# Patient Record
Sex: Male | Born: 1970 | Race: Black or African American | Hispanic: No | Marital: Married | Smoking: Current every day smoker
Health system: Southern US, Community
[De-identification: ages and names within clinical notes are randomized; demographics above are authoritative.]

## PROBLEM LIST (undated history)

## (undated) DIAGNOSIS — F419 Anxiety disorder, unspecified: Secondary | ICD-10-CM

## (undated) DIAGNOSIS — F329 Major depressive disorder, single episode, unspecified: Secondary | ICD-10-CM

## (undated) DIAGNOSIS — I10 Essential (primary) hypertension: Secondary | ICD-10-CM

## (undated) DIAGNOSIS — F32A Depression, unspecified: Secondary | ICD-10-CM

## (undated) DIAGNOSIS — J189 Pneumonia, unspecified organism: Secondary | ICD-10-CM

## (undated) DIAGNOSIS — E871 Hypo-osmolality and hyponatremia: Secondary | ICD-10-CM

## (undated) DIAGNOSIS — E7800 Pure hypercholesterolemia, unspecified: Secondary | ICD-10-CM

## (undated) DIAGNOSIS — D649 Anemia, unspecified: Secondary | ICD-10-CM

## (undated) DIAGNOSIS — F1010 Alcohol abuse, uncomplicated: Secondary | ICD-10-CM

## (undated) HISTORY — PX: ABSCESS DRAINAGE: SHX1119

## (undated) HISTORY — PX: BACK SURGERY: SHX140

---

## 2016-07-20 ENCOUNTER — Encounter (HOSPITAL_COMMUNITY): Payer: Self-pay

## 2016-07-20 ENCOUNTER — Emergency Department (HOSPITAL_COMMUNITY): Payer: Self-pay

## 2016-07-20 ENCOUNTER — Emergency Department (HOSPITAL_COMMUNITY)
Admission: EM | Admit: 2016-07-20 | Discharge: 2016-07-21 | Disposition: A | Discharge status: Home / Self Care | Payer: Self-pay | Attending: Emergency Medicine | Admitting: Emergency Medicine

## 2016-07-20 DIAGNOSIS — F10239 Alcohol dependence with withdrawal, unspecified: Secondary | ICD-10-CM | POA: Insufficient documentation

## 2016-07-20 DIAGNOSIS — F17290 Nicotine dependence, other tobacco product, uncomplicated: Secondary | ICD-10-CM | POA: Insufficient documentation

## 2016-07-20 DIAGNOSIS — I10 Essential (primary) hypertension: Secondary | ICD-10-CM | POA: Insufficient documentation

## 2016-07-20 DIAGNOSIS — Z79899 Other long term (current) drug therapy: Secondary | ICD-10-CM | POA: Insufficient documentation

## 2016-07-20 DIAGNOSIS — F10230 Alcohol dependence with withdrawal, uncomplicated: Secondary | ICD-10-CM

## 2016-07-20 DIAGNOSIS — F10930 Alcohol use, unspecified with withdrawal, uncomplicated: Secondary | ICD-10-CM

## 2016-07-20 DIAGNOSIS — R748 Abnormal levels of other serum enzymes: Secondary | ICD-10-CM | POA: Insufficient documentation

## 2016-07-20 DIAGNOSIS — E871 Hypo-osmolality and hyponatremia: Secondary | ICD-10-CM | POA: Insufficient documentation

## 2016-07-20 HISTORY — DX: Essential (primary) hypertension: I10

## 2016-07-20 HISTORY — DX: Depression, unspecified: F32.A

## 2016-07-20 HISTORY — DX: Anxiety disorder, unspecified: F41.9

## 2016-07-20 HISTORY — DX: Hypo-osmolality and hyponatremia: E87.1

## 2016-07-20 HISTORY — DX: Major depressive disorder, single episode, unspecified: F32.9

## 2016-07-20 HISTORY — DX: Alcohol abuse, uncomplicated: F10.10

## 2016-07-20 LAB — COMPREHENSIVE METABOLIC PANEL
ALBUMIN: 4.4 g/dL (ref 3.5–5.0)
ALK PHOS: 79 U/L (ref 38–126)
ALT: 29 U/L (ref 17–63)
AST: 45 U/L — AB (ref 15–41)
Anion gap: 8 (ref 5–15)
BILIRUBIN TOTAL: 0.3 mg/dL (ref 0.3–1.2)
BUN: 20 mg/dL (ref 6–20)
CALCIUM: 9.8 mg/dL (ref 8.9–10.3)
CO2: 25 mmol/L (ref 22–32)
Chloride: 101 mmol/L (ref 101–111)
Creatinine, Ser: 1.03 mg/dL (ref 0.61–1.24)
GFR calc Af Amer: >60 mL/min (ref >60)
GFR calc non Af Amer: >60 mL/min (ref >60)
GLUCOSE: 112 mg/dL — AB (ref 65–99)
Potassium: 4.2 mmol/L (ref 3.5–5.1)
Sodium: 134 mmol/L — ABNORMAL LOW (ref 135–145)
TOTAL PROTEIN: 8.7 g/dL — AB (ref 6.5–8.1)

## 2016-07-20 LAB — CBC
HCT: 22 % — ABNORMAL LOW (ref 39.0–52.0)
Hemoglobin: 7.5 g/dL — ABNORMAL LOW (ref 13.0–17.0)
MCH: 32.3 pg (ref 26.0–34.0)
MCHC: 34.1 g/dL (ref 30.0–36.0)
MCV: 94.8 fL (ref 78.0–100.0)
Platelets: 165 10*3/uL (ref 150–400)
RBC: 2.32 MIL/uL — ABNORMAL LOW (ref 4.22–5.81)
RDW: 12.9 % (ref 11.5–15.5)
WBC: 7.6 10*3/uL (ref 4.0–10.5)

## 2016-07-20 LAB — RAPID STREP SCREEN (MED CTR MEBANE ONLY): Streptococcus, Group A Screen (Direct): NEGATIVE

## 2016-07-20 LAB — LIPASE, BLOOD: Lipase: 1267 U/L — ABNORMAL HIGH (ref 11–51)

## 2016-07-20 LAB — POC OCCULT BLOOD, ED: FECAL OCCULT BLD: NEGATIVE

## 2016-07-20 MED ORDER — SODIUM CHLORIDE 0.9 % IV BOLUS (SEPSIS)
1000.0000 mL | Freq: Once | INTRAVENOUS | Status: AC
  Administered 2016-07-20: 1000 mL via INTRAVENOUS

## 2016-07-20 MED ORDER — ACETAMINOPHEN 325 MG PO TABS
650.0000 mg | ORAL_TABLET | Freq: Once | ORAL | Status: AC | PRN
  Administered 2016-07-20: 650 mg via ORAL
  Filled 2016-07-20: qty 2

## 2016-07-20 NOTE — ED Triage Notes (Signed)
EMS reports per facility his sodium was low and was not comfortable with him being there over the weekend. Patient complained to EMS of a sore throat and cough had tried OTC medications and cough drops without relief. Per EMS patient is under going detox from librium. Per EMS patient does have tremors but this is not new for him.

## 2016-07-20 NOTE — ED Provider Notes (Signed)
[facility]-EMERGENCY DEPT Provider Note   CSN: [id number] Arrival date & time: 07/20/16  1839     History   Chief Complaint Chief Complaint  Patient presents with  . Abnormal Lab  . Sore Throat    X1 MONTH    HPI Jaime Hodges is a 45 y.o. male.  The history is provided by the patient.  Abnormal Lab  Sore Throat  Pertinent negatives include no chest pain and no shortness of breath.  Patient was reportedly sent in from [facility] for low sodium. States he was told it was 129. States she was told to come in because with the weekend coming she didn't know if she would be able to get labs back. Patient had been feeling bad over the last month. Has had sore throat. Has had a lot of difficulty eating. States he had some throwing up. He is on Librium for detox off of alcohol. Does drink about 4-6 beers a day. He is tremulous but states this is much improved. His abdominal pain is improved and states she can eat much more now than he could a couple weeks ago. Has some dull abdominal pain but states it improved. No lightheadedness or dizziness. States he had diarrhea up until recently. Denies black stools. States he had been losing weight also.  Past Medical History:  Diagnosis Date  . Alcohol abuse   . Anxiety   . Depression   . Hypertension   . Low sodium levels     There are no active problems to display for this patient.   Past Surgical History:  Procedure Laterality Date  . ABSCESS DRAINAGE     NECK       Home Medications    Prior to Admission medications   Medication Sig Start Date End Date Taking? Authorizing Provider  ALPRAZolam (XANAX) 0.25 MG tablet Take 0.25 mg by mouth 2 (two) times daily. 04/26/16  Yes Historical Provider, MD  amLODipine (NORVASC) 5 MG tablet Take 1 tablet by mouth daily. 06/30/16  Yes Historical Provider, MD  atorvastatin (LIPITOR) 40 MG tablet Take 40 mg by mouth daily. 04/27/16  Yes Historical Provider, MD  busPIRone (BUSPAR) 7.5 MG tablet  Take 7.5 mg by mouth 2 (two) times daily. 05/18/16  Yes Historical Provider, MD  escitalopram (LEXAPRO) 10 MG tablet Take 10 mg by mouth daily. 05/23/16  Yes Historical Provider, MD  meclizine (ANTIVERT) 25 MG tablet Take 1 tablet by mouth as needed for dizziness. 06/30/16  Yes Historical Provider, MD  valsartan (DIOVAN) 320 MG tablet Take 320 mg by mouth daily. 04/26/16  Yes Historical Provider, MD    Family History History reviewed. No pertinent family history.  Social History Social History  Substance Use Topics  . Smoking status: Current Every Day Smoker    Packs/day: 0.50    Types: Cigars  . Smokeless tobacco: Never Used  . Alcohol use 2.4 oz/week    4 Cans of beer per week     Comment: DAILY     Allergies   Aspirin   Review of Systems Review of Systems  Constitutional: Positive for appetite change. Negative for fever.  HENT: Positive for sore throat.   Eyes: Negative for visual disturbance.  Respiratory: Negative for shortness of breath.   Cardiovascular: Negative for chest pain.  Gastrointestinal: Positive for anal bleeding.  Genitourinary: Negative for flank pain.  Musculoskeletal: Negative for back pain.  Skin: Negative for wound.  Neurological: Positive for tremors.  Psychiatric/Behavioral: Negative for confusion.  Physical Exam Updated Vital Signs BP 136/100   Pulse 71   Temp 99.4 F (37.4 C) (Oral)   Resp 22   Ht 5\' 9"  (1.753 m)   Wt 146 lb (66.2 kg)   SpO2 100%   BMI 21.56 kg/m   Physical Exam  Constitutional: He appears well-developed.  HENT:  Head: Atraumatic.  Eyes: EOM are normal.  Neck: Neck supple.  Cardiovascular: Normal rate.   Pulmonary/Chest: Effort normal.  Abdominal: Soft. There is no tenderness.  Musculoskeletal: He exhibits no edema.  Neurological: He is alert.  Patient does have a tremor in both his hands.  Skin: There is pallor.  Psychiatric: He has a normal mood and affect.     ED Treatments / Results  Labs (all  labs ordered are listed, but only abnormal results are displayed) Labs Reviewed  LIPASE, BLOOD - Abnormal; Notable for the following:       Result Value   Lipase 1,267 (*)    All other components within normal limits  COMPREHENSIVE METABOLIC PANEL - Abnormal; Notable for the following:    Sodium 134 (*)    Glucose, Bld 112 (*)    Total Protein 8.7 (*)    AST 45 (*)    All other components within normal limits  CBC - Abnormal; Notable for the following:    RBC 2.32 (*)    Hemoglobin 7.5 (*)    HCT 22.0 (*)    All other components within normal limits  RAPID STREP SCREEN (NOT AT [facility])  CULTURE, GROUP A STREP ([facility])  URINALYSIS, ROUTINE W REFLEX MICROSCOPIC (NOT AT [facility])  POC OCCULT BLOOD, ED    EKG  EKG Interpretation None       Radiology Dg Chest 2 View  Result Date: 07/20/2016 CLINICAL DATA:  45 year old male with cough EXAM: CHEST  2 VIEW COMPARISON:  None. FINDINGS: The heart size and mediastinal contours are within normal limits. Both lungs are clear. The visualized skeletal structures are unremarkable. IMPRESSION: No active cardiopulmonary disease. Electronically Signed   By: Elgie Collard M.D.   On: 07/20/2016 21:19    Procedures Procedures (including critical care time)  Medications Ordered in ED Medications  acetaminophen (TYLENOL) tablet 650 mg (650 mg Oral Given 07/20/16 2009)  sodium chloride 0.9 % bolus 1,000 mL (1,000 mLs Intravenous New Bag/Given 07/20/16 2159)     Initial Impression / Assessment and Plan / ED Course  I have reviewed the triage vital signs and the nursing notes.  Pertinent labs & imaging results that were available during my care of the patient were reviewed by me and considered in my medical decision making (see chart for details).  Clinical Course    Patient presents with hyponatremia. Reportedly had sodium of 129. He is at [facility] for his alcohol abuse. He's been clean for about 3 days. States he is feeling much  better. Also had a sore throat. He is reportedly on Librium. He said his tremors improved. States his appetite is improved a lot. No real abdominal tenderness. His lipase is elevated but think this is likely decreasing. He has however had weight loss and will need to have this followed. Sodium is improved OB discharge home. Sodium is now 134.  Final Clinical Impressions(s) / ED Diagnoses   Final diagnoses:  Hyponatremia  Alcohol withdrawal syndrome without complication (HCC)  Elevated lipase    New Prescriptions New Prescriptions   No medications on file     Benjiman Core, MD 07/20/16 2347

## 2016-07-20 NOTE — Discharge Instructions (Signed)
Your sodium is improved to 134. You have been given 1 L of IV normal saline also. Continue your improved oral intake. Lipase is elevated but since your abdominal pain has improved I think this is likely decreasing. Return for increasing pain or fever cannot tolerate orals. Follow-up for further monitoring of the lipase as an outpatient.

## 2016-07-23 LAB — CULTURE, GROUP A STREP (THRC)

## 2016-07-24 ENCOUNTER — Telehealth (HOSPITAL_BASED_OUTPATIENT_CLINIC_OR_DEPARTMENT_OTHER): Payer: Self-pay | Admitting: Emergency Medicine

## 2016-07-24 NOTE — Progress Notes (Signed)
ED Antimicrobial Stewardship Positive Culture Follow Up   Jaime Hodges is an 45 y.o. male who [facility] with a chief complaint of  Chief Complaint  Patient presents with  . Abnormal Lab  . Sore Throat    X1 MONTH    Recent Results (from the past 720 hour(s))  Rapid strep screen     Status: None   Collection Time: 07/20/16  8:05 PM  Result Value Ref Range Status   Streptococcus, Group A Screen (Direct) NEGATIVE NEGATIVE Final    Comment: (NOTE) A Rapid Antigen test may result negative if the antigen level in the sample is below the detection level of this test. The FDA has not cleared this test as a stand-alone test therefore the rapid antigen negative result has reflexed to a Group A Strep culture.   Culture, group A strep     Status: None   Collection Time: 07/20/16  8:05 PM  Result Value Ref Range Status   Specimen Description THROAT  Final   Special Requests NONE Reflexed from [id number]  Final   Culture MODERATE GROUP A STREP (S.PYOGENES) ISOLATED  Final   Report Status 07/23/2016 FINAL  Final    [x]  Patient discharged originally without antimicrobial agent and treatment is now indicated  New antibiotic prescription: amoxicillin 500 mg PO BID for 10 days  ED Provider: Allen DerryMercedes Camprubi-Soms, PA-C  Mackie Paienee Ackley, PharmD PGY1 Pharmacy Resident Pager: [phone] 07/24/2016 9:01 AM

## 2016-07-24 NOTE — Telephone Encounter (Signed)
Post ED Visit - Positive Culture Follow-up: Successful Patient Follow-Up  Culture assessed and recommendations reviewed by: []  Enzo BiNathan Batchelder, Pharm.D. []  Celedonio MiyamotoJeremy Frens, Pharm.D., BCPS []  Garvin FilaMike Maccia, Pharm.D. []  Georgina PillionElizabeth Martin, Pharm.D., BCPS []  SouthworthMinh Pham, [address] BoulevardPharm.D., BCPS, AAHIVP []  Estella HuskMichelle Turner, Pharm.D., BCPS, AAHIVP []  Tennis Mustassie Stewart, Pharm.D. []  Sherle Poeob Vincent, [place]arm[place]D. Mackie Paienee Ackley PharmD  Positive strep culture  [x]  Patient discharged without antimicrobial prescription and treatment is now indicated []  Organism is resistant to prescribed ED discharge antimicrobial []  Patient with positive blood cultures  Changes discussed with ED provider: Allen DerryMercedes Camprubi-Soms [facility] New antibiotic prescription Amoxicillin 500mg  po bid x 10 days  Attempting to reach patient   Berle MullMiller, Zaylee Cornia 07/24/2016, 1:01 PM

## 2016-07-25 ENCOUNTER — Inpatient Hospital Stay (HOSPITAL_COMMUNITY): Payer: BLUE CROSS/BLUE SHIELD

## 2016-07-25 ENCOUNTER — Encounter (HOSPITAL_COMMUNITY): Payer: Self-pay | Admitting: Emergency Medicine

## 2016-07-25 ENCOUNTER — Observation Stay (HOSPITAL_COMMUNITY)
Admission: EM | Admit: 2016-07-25 | Discharge: 2016-07-28 | Payer: BLUE CROSS/BLUE SHIELD | Attending: Internal Medicine | Admitting: Internal Medicine

## 2016-07-25 ENCOUNTER — Emergency Department (HOSPITAL_COMMUNITY): Payer: BLUE CROSS/BLUE SHIELD

## 2016-07-25 DIAGNOSIS — R042 Hemoptysis: Secondary | ICD-10-CM | POA: Diagnosis not present

## 2016-07-25 DIAGNOSIS — F329 Major depressive disorder, single episode, unspecified: Secondary | ICD-10-CM | POA: Insufficient documentation

## 2016-07-25 DIAGNOSIS — Z8249 Family history of ischemic heart disease and other diseases of the circulatory system: Secondary | ICD-10-CM | POA: Insufficient documentation

## 2016-07-25 DIAGNOSIS — K859 Acute pancreatitis without necrosis or infection, unspecified: Secondary | ICD-10-CM | POA: Diagnosis not present

## 2016-07-25 DIAGNOSIS — Z836 Family history of other diseases of the respiratory system: Secondary | ICD-10-CM

## 2016-07-25 DIAGNOSIS — R918 Other nonspecific abnormal finding of lung field: Secondary | ICD-10-CM | POA: Insufficient documentation

## 2016-07-25 DIAGNOSIS — R634 Abnormal weight loss: Secondary | ICD-10-CM | POA: Insufficient documentation

## 2016-07-25 DIAGNOSIS — E7800 Pure hypercholesterolemia, unspecified: Secondary | ICD-10-CM | POA: Insufficient documentation

## 2016-07-25 DIAGNOSIS — Z7289 Other problems related to lifestyle: Secondary | ICD-10-CM

## 2016-07-25 DIAGNOSIS — F17210 Nicotine dependence, cigarettes, uncomplicated: Secondary | ICD-10-CM | POA: Insufficient documentation

## 2016-07-25 DIAGNOSIS — F1010 Alcohol abuse, uncomplicated: Secondary | ICD-10-CM | POA: Diagnosis not present

## 2016-07-25 DIAGNOSIS — F419 Anxiety disorder, unspecified: Secondary | ICD-10-CM | POA: Diagnosis not present

## 2016-07-25 DIAGNOSIS — R21 Rash and other nonspecific skin eruption: Secondary | ICD-10-CM | POA: Insufficient documentation

## 2016-07-25 DIAGNOSIS — D509 Iron deficiency anemia, unspecified: Secondary | ICD-10-CM | POA: Insufficient documentation

## 2016-07-25 DIAGNOSIS — Z833 Family history of diabetes mellitus: Secondary | ICD-10-CM | POA: Insufficient documentation

## 2016-07-25 DIAGNOSIS — R1013 Epigastric pain: Secondary | ICD-10-CM | POA: Diagnosis not present

## 2016-07-25 DIAGNOSIS — Z8349 Family history of other endocrine, nutritional and metabolic diseases: Secondary | ICD-10-CM

## 2016-07-25 DIAGNOSIS — J020 Streptococcal pharyngitis: Secondary | ICD-10-CM | POA: Diagnosis not present

## 2016-07-25 DIAGNOSIS — F17290 Nicotine dependence, other tobacco product, uncomplicated: Secondary | ICD-10-CM

## 2016-07-25 DIAGNOSIS — K0510 Chronic gingivitis, plaque induced: Secondary | ICD-10-CM | POA: Diagnosis not present

## 2016-07-25 DIAGNOSIS — Z23 Encounter for immunization: Secondary | ICD-10-CM | POA: Diagnosis not present

## 2016-07-25 DIAGNOSIS — K068 Other specified disorders of gingiva and edentulous alveolar ridge: Secondary | ICD-10-CM | POA: Diagnosis not present

## 2016-07-25 DIAGNOSIS — Z886 Allergy status to analgesic agent status: Secondary | ICD-10-CM | POA: Diagnosis not present

## 2016-07-25 DIAGNOSIS — Z79899 Other long term (current) drug therapy: Secondary | ICD-10-CM

## 2016-07-25 DIAGNOSIS — I10 Essential (primary) hypertension: Secondary | ICD-10-CM | POA: Insufficient documentation

## 2016-07-25 DIAGNOSIS — Z789 Other specified health status: Secondary | ICD-10-CM

## 2016-07-25 DIAGNOSIS — R630 Anorexia: Secondary | ICD-10-CM | POA: Insufficient documentation

## 2016-07-25 DIAGNOSIS — D649 Anemia, unspecified: Secondary | ICD-10-CM | POA: Diagnosis present

## 2016-07-25 DIAGNOSIS — F411 Generalized anxiety disorder: Secondary | ICD-10-CM

## 2016-07-25 DIAGNOSIS — R1310 Dysphagia, unspecified: Secondary | ICD-10-CM

## 2016-07-25 DIAGNOSIS — K852 Alcohol induced acute pancreatitis without necrosis or infection: Secondary | ICD-10-CM

## 2016-07-25 HISTORY — DX: Pure hypercholesterolemia, unspecified: E78.00

## 2016-07-25 HISTORY — DX: Pneumonia, unspecified organism: J18.9

## 2016-07-25 HISTORY — DX: Anemia, unspecified: D64.9

## 2016-07-25 LAB — URINALYSIS, ROUTINE W REFLEX MICROSCOPIC
BILIRUBIN URINE: NEGATIVE
Glucose, UA: NEGATIVE mg/dL
Hgb urine dipstick: NEGATIVE
KETONES UR: NEGATIVE mg/dL
LEUKOCYTES UA: NEGATIVE
NITRITE: NEGATIVE
PH: 7 (ref 5.0–8.0)
PROTEIN: NEGATIVE mg/dL
Specific Gravity, Urine: 1.016 (ref 1.005–1.030)

## 2016-07-25 LAB — BASIC METABOLIC PANEL
ANION GAP: 3 — AB (ref 5–15)
BUN: 11 mg/dL (ref 6–20)
CO2: 29 mmol/L (ref 22–32)
Calcium: 9.8 mg/dL (ref 8.9–10.3)
Chloride: 108 mmol/L (ref 101–111)
Creatinine, Ser: 0.77 mg/dL (ref 0.61–1.24)
GFR calc Af Amer: >60 mL/min (ref >60)
GLUCOSE: 130 mg/dL — AB (ref 65–99)
POTASSIUM: 4 mmol/L (ref 3.5–5.1)
Sodium: 140 mmol/L (ref 135–145)

## 2016-07-25 LAB — FOLATE: FOLATE: 19.2 ng/mL (ref >5.9)

## 2016-07-25 LAB — IRON AND TIBC
Iron: 39 ug/dL — ABNORMAL LOW (ref 45–182)
SATURATION RATIOS: 13 % — AB (ref 17.9–39.5)
TIBC: 293 ug/dL (ref 250–450)
UIBC: 254 ug/dL

## 2016-07-25 LAB — CBC
HEMATOCRIT: 21.6 % — AB (ref 39.0–52.0)
Hemoglobin: 7 g/dL — ABNORMAL LOW (ref 13.0–17.0)
MCH: 32 pg (ref 26.0–34.0)
MCHC: 32.4 g/dL (ref 30.0–36.0)
MCV: 98.6 fL (ref 78.0–100.0)
PLATELETS: 171 10*3/uL (ref 150–400)
RBC: 2.19 MIL/uL — AB (ref 4.22–5.81)
RDW: 14.1 % (ref 11.5–15.5)
WBC: 5.7 10*3/uL (ref 4.0–10.5)

## 2016-07-25 LAB — RETICULOCYTES
RBC.: 2.15 MIL/uL — AB (ref 4.22–5.81)
RETIC COUNT ABSOLUTE: 64.5 10*3/uL (ref 19.0–186.0)
RETIC CT PCT: 3 % (ref 0.4–3.1)

## 2016-07-25 LAB — FERRITIN: Ferritin: 335 ng/mL (ref 24–336)

## 2016-07-25 LAB — LACTATE DEHYDROGENASE: LDH: 132 U/L (ref 98–192)

## 2016-07-25 LAB — POC OCCULT BLOOD, ED: Fecal Occult Bld: NEGATIVE

## 2016-07-25 LAB — MAGNESIUM: MAGNESIUM: 1.5 mg/dL — AB (ref 1.7–2.4)

## 2016-07-25 LAB — PROTIME-INR
INR: 1.07
Prothrombin Time: 13.9 seconds (ref 11.4–15.2)

## 2016-07-25 LAB — VITAMIN B12: VITAMIN B 12: 483 pg/mL (ref 180–914)

## 2016-07-25 LAB — LIPASE, BLOOD: LIPASE: 375 U/L — AB (ref 11–51)

## 2016-07-25 MED ORDER — ADULT MULTIVITAMIN W/MINERALS CH
1.0000 | ORAL_TABLET | Freq: Every day | ORAL | Status: DC
  Administered 2016-07-25 – 2016-07-28 (×4): 1 via ORAL
  Filled 2016-07-25 (×4): qty 1

## 2016-07-25 MED ORDER — AMOXICILLIN 500 MG PO CAPS
500.0000 mg | ORAL_CAPSULE | Freq: Two times a day (BID) | ORAL | Status: DC
  Administered 2016-07-25 – 2016-07-28 (×6): 500 mg via ORAL
  Filled 2016-07-25 (×8): qty 1

## 2016-07-25 MED ORDER — BOOST / RESOURCE BREEZE PO LIQD
1.0000 | Freq: Two times a day (BID) | ORAL | Status: DC
  Administered 2016-07-26 – 2016-07-28 (×5): 1 via ORAL

## 2016-07-25 MED ORDER — ESCITALOPRAM OXALATE 10 MG PO TABS
10.0000 mg | ORAL_TABLET | Freq: Every day | ORAL | Status: DC
  Administered 2016-07-25 – 2016-07-28 (×4): 10 mg via ORAL
  Filled 2016-07-25 (×4): qty 1

## 2016-07-25 MED ORDER — FERUMOXYTOL INJECTION 510 MG/17 ML
510.0000 mg | Freq: Once | INTRAVENOUS | Status: AC
  Administered 2016-07-25: 510 mg via INTRAVENOUS
  Filled 2016-07-25: qty 17

## 2016-07-25 MED ORDER — PANTOPRAZOLE SODIUM 40 MG PO TBEC
40.0000 mg | DELAYED_RELEASE_TABLET | Freq: Two times a day (BID) | ORAL | Status: DC
  Administered 2016-07-25 – 2016-07-27 (×4): 40 mg via ORAL
  Filled 2016-07-25 (×5): qty 1

## 2016-07-25 MED ORDER — VITAMIN B-1 100 MG PO TABS
100.0000 mg | ORAL_TABLET | Freq: Every day | ORAL | Status: DC
  Administered 2016-07-25 – 2016-07-28 (×4): 100 mg via ORAL
  Filled 2016-07-25 (×4): qty 1

## 2016-07-25 MED ORDER — FAMOTIDINE IN NACL 20-0.9 MG/50ML-% IV SOLN
20.0000 mg | Freq: Two times a day (BID) | INTRAVENOUS | Status: DC
  Administered 2016-07-25: 20 mg via INTRAVENOUS
  Filled 2016-07-25 (×2): qty 50

## 2016-07-25 MED ORDER — SODIUM CHLORIDE 0.9 % IV BOLUS (SEPSIS)
500.0000 mL | Freq: Once | INTRAVENOUS | Status: AC
  Administered 2016-07-25: 500 mL via INTRAVENOUS

## 2016-07-25 MED ORDER — BUSPIRONE HCL 15 MG PO TABS
7.5000 mg | ORAL_TABLET | Freq: Two times a day (BID) | ORAL | Status: DC
  Administered 2016-07-25 – 2016-07-26 (×2): 7.5 mg via ORAL
  Filled 2016-07-25 (×2): qty 1

## 2016-07-25 MED ORDER — FOLIC ACID 1 MG PO TABS
1.0000 mg | ORAL_TABLET | Freq: Every day | ORAL | Status: DC
  Administered 2016-07-25 – 2016-07-28 (×4): 1 mg via ORAL
  Filled 2016-07-25 (×4): qty 1

## 2016-07-25 MED ORDER — INFLUENZA VAC SPLIT QUAD 0.5 ML IM SUSY
0.5000 mL | PREFILLED_SYRINGE | INTRAMUSCULAR | Status: AC
  Administered 2016-07-26: 0.5 mL via INTRAMUSCULAR
  Filled 2016-07-25: qty 0.5

## 2016-07-25 MED ORDER — PNEUMOCOCCAL VAC POLYVALENT 25 MCG/0.5ML IJ INJ
0.5000 mL | INJECTION | INTRAMUSCULAR | Status: AC
  Administered 2016-07-26: 0.5 mL via INTRAMUSCULAR
  Filled 2016-07-25: qty 0.5

## 2016-07-25 NOTE — H&P (Signed)
Date: 07/25/2016               Patient Name:  Jaime Hodges MRN: [id number]  DOB: 07-03-1971 Age / Sex: 45 y.o., male   PCP: No primary care provider on file.         Medical Service: Internal Medicine Teaching Service         Attending Physician: Dr. Inez Catalina, MD    First Contact: Dr. Reymundo Poll Pager: [phone]  Second Contact: Dr. Gwynn Burly Pager: [phone]       After Hours (After 5p/  First Contact Pager: [phone]  weekends / holidays): Second Contact Pager: [phone]   Chief Complaint: Sore Throat  History of Present Illness: Patient is a 45 yo M with a pmhx significant for HTN and alcohol use disorder who presents with sore throat for 3 months. Patient is currently undergoing outpatient alcohol detox at [facility] and has been sober for 7 days. He was sent to the ER on the 19th of this month for low sodium and his rapid strep screen was negative at that time; however the cultures later grew group A strep. Per chart review, it appears the patient was unable to be reached by telephone and he has not received any treatment. He also endorses a productive cough with hemoptysis on multiple occasions. He reports an episode of emesis a few days ago that was red in color, but he had eaten ravioli that evening as well. Patient says he experienced significant weight loss while he was drinking due to poor po intake. He reports losing about 100 lbs in the past 1.5 years. Since starting rehab, he has started to gain weight back and his BM are starting to normalize. When he first started to eat again, he reports multiple episodes of loose stool a day (5-6x). Over the past few days his stools have begun to harden and become less frequent, now 2-3 x a day. He denies any blood in his stool. He denies SOB or feeling light headed or dizzy. He denies fevers but endorses chills for the past few days. He also developed a rash on the back on his next and stomach this morning.   In the ED,  vitals were stable (T 99.4, BP 123/90, HR 96, RR 22, oxygen 100% on RA). Labs were significant for normocytic anemia with hgb 7.0. FOBT was negative. Retic count was normal at 3.0. Vit B12 and Folate were both within normal limits. Ferritin was also normal but total iron was low at 39. UA is pending. Lipase was elevated 375 but down from 1,267 5 days ago. CXR was negative for acute disease.   Meds:  No outpatient prescriptions have been marked as taking for the 07/25/16 encounter [facility] Encounter).     Allergies: Allergies as of 07/25/2016 - Review Complete 07/25/2016  Allergen Reaction Noted  . Aspirin Other (See Comments) 07/20/2016   Past Medical History:  Diagnosis Date  . Alcohol abuse   . Anxiety   . Depression   . Hypertension   . Low sodium levels     Family History: HTN, DM, HLD and tracheostomy all in his mom   Social History: Currently living at [facility] for alcohol detox. Normally lives at home with his mother. He and his wife divorced one year ago. He smokes 2-3 black and mild's a day, started smoking about 6 months ago. He denies illicit drug use.   Review of Systems: A complete ROS was negative  except as per HPI.   Physical Exam: Blood pressure 135/97, pulse 83, temperature 99.1 F (37.3 C), temperature source Oral, resp. rate 19, SpO2 100 %. Constitutional: NAD, appears comfortable, Hoarse voice HEENT: Active gingival bleeding, posterior pharynx with mucosal hemorrhage.  Neck: Supple, trachea midline.  Cardiovascular: RRR, no murmurs, rubs, or gallops.  Pulmonary/Chest: CTAB, no wheezes, rales, or rhonchi. Abdominal: Soft, non tender, non distended. +BS.  Extremities: Warm and well perfused. Distal pulses intact. No edema.  Neurological: A&Ox3, CN II - XII grossly intact.  Skin: Follicular appearing rash on posterior neck and stomach  Psychiatric: Normal mood and affect  EKG: Personally reviewed. Normal sinus rhythm. No ischemic changes.   CXR:  Personally reviewed. Normal chest xray.   Assessment & Plan by Problem:  Anemia: With active gingival bleeding on evaluation. Platelets are normal. No family history of bleeding disorders that he is aware of. Unclear etiology. Patient reports a questionable episode of hematemesis recently. With his history of chronic alcohol use, esophageal varices are a possibility. However PT and INR are normal. LFTs are largely normal but this does not rule out cirrhosis. FOBT was negative today and patient denies blood in his stool. UA without hematuria. S/p IV feraheme in ED.  -- Vit C level  -- Mag level -- RUQ ultrasound to evaluate for cirrhosis  -- LDH and haptoglobin to evaluate for hemolysis -- HIV antibodies pending  -- May consider further hemophilia work up if other labs are non contributory  -- Protonix 40 mg BID  Streptococcal Pharyngitis: Recent throat swab culture with group A strep.  -- Amoxicillin 500 mg BID   HTN: -- Hold home amlodipine 5 mg and HCTZ 12.5 mg daily  Alcohol Use Disorder: -- Continue folic acid, thiamine, and multivitamin daily  Depression: -- Continue buspar BID -- Continue lexapro daily   FEN: No fluids, replete lytes prn, regular diet VTE ppx: SCDs Code Status: FULL  Dispo: Admit patient to Observation with expected length of stay less than 2 midnights.  Signed: Reymundo Pollarolyn Lawson Mahone, MD 07/25/2016, 2:27 PM  Pager: [phone]

## 2016-07-25 NOTE — ED Triage Notes (Signed)
The patient said he has been spitting up blood so the facility called.  The [facility] for [facility] wanted [facility] seen and a full workup done.  He rated his pain 7/10.  The paitent has not taken anything for the pain.  He is allergic to Aspirin.

## 2016-07-25 NOTE — ED Provider Notes (Signed)
[facility]-EMERGENCY DEPT Provider Note   CSN: [id number] Arrival date & time: 07/25/16  1125     History   Chief Complaint Chief Complaint  Patient presents with  . Sore Throat    The patient said he has been spitting up blood so the facility called.  The MD for [facility] wanted him seen and a full workup done.  He rated his pain 7/10.  The paitent has not taken anything for the pain.  He is allergic to Aspirin.    HPI Jaime Hodges is a 45 y.o. male.  HPI  44 y.o. [facility] from [facility] presents today stating sore throat and hemoptysis.  Sore throat for three weeks, worse with swallowing, no improvement with lozenges or salt water gargles, no fb sensation.  Feels like he will choke with eating.  No fever, abdominal pain,chest pain,or dyspnea.  Cough productive of red tinged sputum began yesterday and occurred again this am.  Patient has been detox'd from alchol now for 8 days.  He continues to take his antihypertensives.    Patient with anemia noted on prior lab exam at [facility] 10/19.  Patient endorses tarry stools and loose stools for several months.  No fever or antibiotics associated. He denies prior history of gi bleeding, ulcer, or nsaids- patient allergic to aspirin.   Past Medical History:  Diagnosis Date  . Alcohol abuse   . Anxiety   . Depression   . Hypertension   . Low sodium levels     There are no active problems to display for this patient.   Past Surgical History:  Procedure Laterality Date  . ABSCESS DRAINAGE     NECK       Home Medications    Prior to Admission medications   Medication Sig Start Date End Date Taking? Authorizing Provider  ALPRAZolam (XANAX) 0.25 MG tablet Take 0.25 mg by mouth 2 (two) times daily. 04/26/16   Historical Provider, MD  amLODipine (NORVASC) 5 MG tablet Take 1 tablet by mouth daily. 06/30/16   Historical Provider, MD  atorvastatin (LIPITOR) 40 MG tablet Take 40 mg by mouth daily. 04/27/16   Historical Provider, MD    busPIRone (BUSPAR) 7.5 MG tablet Take 7.5 mg by mouth 2 (two) times daily. 05/18/16   Historical Provider, MD  escitalopram (LEXAPRO) 10 MG tablet Take 10 mg by mouth daily. 05/23/16   Historical Provider, MD  meclizine (ANTIVERT) 25 MG tablet Take 1 tablet by mouth as needed for dizziness. 06/30/16   Historical Provider, MD  valsartan (DIOVAN) 320 MG tablet Take 320 mg by mouth daily. 04/26/16   Historical Provider, MD    Family History History reviewed. No pertinent family history.  Social History Social History  Substance Use Topics  . Smoking status: Current Every Day Smoker    Packs/day: 0.50    Types: Cigars  . Smokeless tobacco: Never Used  . Alcohol use 2.4 oz/week    4 Cans of beer per week     Comment: DAILY     Allergies   Aspirin   Review of Systems Review of Systems  All other systems reviewed and are negative.    Physical Exam Updated Vital Signs BP (!) 150/104 (BP Location: Right Arm)   Pulse 108   Temp 99.1 F (37.3 C) (Oral)   Resp 16   SpO2 100%   Physical Exam  Constitutional: He is oriented to person, place, and time. He appears well-developed and well-nourished.  HENT:  Head: Normocephalic and  atraumatic.  Right Ear: External ear normal.  Left Ear: External ear normal.  Nose: Nose normal.  Mouth/Throat: Oropharynx is clear and moist.  Eyes: Conjunctivae and EOM are normal. Pupils are equal, round, and reactive to light.  Neck: Normal range of motion. Neck supple.  Cardiovascular: Normal rate, regular rhythm, normal heart sounds and intact distal pulses.   Pulmonary/Chest: Effort normal and breath sounds normal. No respiratory distress. He has no wheezes. He exhibits no tenderness.  Abdominal: Soft. Bowel sounds are normal. He exhibits no distension and no mass. There is no tenderness. There is no guarding.  Genitourinary: Rectum normal. Rectal exam shows guaiac negative stool.  Musculoskeletal: Normal range of motion.  Neurological: He is  alert and oriented to person, place, and time. He has normal reflexes. He exhibits normal muscle tone. Coordination normal.  Skin: Skin is warm and dry.  Psychiatric: He has a normal mood and affect. His behavior is normal. Judgment and thought content normal.  Nursing note and vitals reviewed.    ED Treatments / Results  Labs (all labs ordered are listed, but only abnormal results are displayed) Labs Reviewed  CBC - Abnormal; Notable for the following:       Result Value   RBC 2.19 (*)    Hemoglobin 7.0 (*)    HCT 21.6 (*)    All other components within normal limits  BASIC METABOLIC PANEL - Abnormal; Notable for the following:    Glucose, Bld 130 (*)    Anion gap 3 (*)    All other components within normal limits  LIPASE, BLOOD - Abnormal; Notable for the following:    Lipase 375 (*)    All other components within normal limits  PROTIME-INR  POC OCCULT BLOOD, ED    EKG  EKG Interpretation None       Radiology Dg Chest 2 View  Result Date: 07/25/2016 CLINICAL DATA:  Productive cough for 2-3 months EXAM: CHEST  2 VIEW COMPARISON:  07/20/2016 FINDINGS: The heart size and mediastinal contours are within normal limits. Both lungs are clear. The visualized skeletal structures are unremarkable. IMPRESSION: No active cardiopulmonary disease. Electronically Signed   By: Elige KoHetal  Patel   On: 07/25/2016 12:19    Procedures Procedures (including critical care time)  Medications Ordered in ED Medications  sodium chloride 0.9 % bolus 500 mL (not administered)  famotidine (PEPCID) IVPB 20 mg premix (not administered)     Initial Impression / Assessment and Plan / ED Course  I have reviewed the triage vital signs and the nursing notes.  Pertinent labs & imaging results that were available during my care of the patient were reviewed by me and considered in my medical decision making (see chart for details).  1-anemia- patient with worsening anemia and hemoglobin decreased from  7.5 to 7.0 durig past 5 days.  Hemocult negative.  Anemia panel held pending consult with admitting team. 2-alcohol abuse- patient with alcohol abuse but detox'd 8 days and no s/s acute etoh withdraawal. 3-pancreatitis- lipase improved from prior and no vomiting 4- hemoptysis- most c.w. With bronchitis by history. 5-dysphagia- will need work up with some imaging of throat and neck.  Clinical Course    Discussed with Dr. Earlene PlaterWallace and plan admission to teaching service  Final Clinical Impressions(s) / ED Diagnoses   Final diagnoses:  Anemia, unspecified type  Dysphagia, unspecified type  Alcohol abuse    New Prescriptions New Prescriptions   No medications on file     Margarita Grizzleanielle Daine Croker, MD  07/25/16 1414  

## 2016-07-25 NOTE — ED Notes (Signed)
Delay in lab draw nurse and dr examining pt.

## 2016-07-25 NOTE — Progress Notes (Signed)
Patient complaining of sore throat. I paged internal medicine for an order for analgesic. Waiting call back.

## 2016-07-25 NOTE — Progress Notes (Signed)
Jaime Hodges [id number] Admission Data: 07/25/2016 6:42 PM Attending Provider: Inez CatalinaEmily B Mullen, MD  PCP:No primary care provider on file. Consults/ Treatment Team:   Jaime Haileyndre Moquin is a 45 y.o. male patient admitted from ED awake, alert  & orientated  X 3,  Full Code, VSS - Blood pressure (!) 158/97, pulse 73, temperature 98.9 F (37.2 C), temperature source Oral, resp. rate 18, height 5\' 5"  (1.651 m), weight 72.7 kg (160 lb 4.8 oz), SpO2 98 %., no c/o shortness of breath, no c/o chest pain, no distress noted.    IV site WDL:  forearm right, condition patent and no redness with a transparent dsg that's clean dry and intact.  Allergies:   Allergies  Allergen Reactions  . Aspirin Other (See Comments)    NOSE BLEED     Past Medical History:  Diagnosis Date  . Alcohol abuse   . Anxiety   . Depression   . Hypertension   . Low sodium levels     History:  obtained from the patient. Tobacco/alcohol: Currently coming from [facility] for [facility].  Pt orientation to unit, room and routine. Information packet given to patient/family and safety video watched.  Admission INP armband ID verified with patient/family, and in place. SR up x 2, fall risk assessment complete with Patient and family verbalizing understanding of risks associated with falls. Pt verbalizes an understanding of how to use the call bell and to call for help before getting out of bed.  Skin, clean-dry- intact without evidence of bruising, or skin tears.   No evidence of skin break down noted on exam. no rashes, no ecchymoses, no petechiae    Will cont to monitor and assist as needed.

## 2016-07-26 ENCOUNTER — Inpatient Hospital Stay (HOSPITAL_COMMUNITY): Payer: BLUE CROSS/BLUE SHIELD

## 2016-07-26 DIAGNOSIS — J020 Streptococcal pharyngitis: Secondary | ICD-10-CM | POA: Diagnosis not present

## 2016-07-26 DIAGNOSIS — D649 Anemia, unspecified: Secondary | ICD-10-CM | POA: Diagnosis not present

## 2016-07-26 DIAGNOSIS — K068 Other specified disorders of gingiva and edentulous alveolar ridge: Secondary | ICD-10-CM | POA: Diagnosis not present

## 2016-07-26 DIAGNOSIS — R042 Hemoptysis: Secondary | ICD-10-CM

## 2016-07-26 LAB — SAVE SMEAR

## 2016-07-26 LAB — CBC
HCT: 22.9 % — ABNORMAL LOW (ref 39.0–52.0)
HEMOGLOBIN: 7.5 g/dL — AB (ref 13.0–17.0)
MCH: 31.8 pg (ref 26.0–34.0)
MCHC: 32.8 g/dL (ref 30.0–36.0)
MCV: 97 fL (ref 78.0–100.0)
Platelets: 206 10*3/uL (ref 150–400)
RBC: 2.36 MIL/uL — ABNORMAL LOW (ref 4.22–5.81)
RDW: 13.9 % (ref 11.5–15.5)
WBC: 6.7 10*3/uL (ref 4.0–10.5)

## 2016-07-26 LAB — COMPREHENSIVE METABOLIC PANEL
ALK PHOS: 51 U/L (ref 38–126)
ALT: 31 U/L (ref 17–63)
ANION GAP: 7 (ref 5–15)
AST: 32 U/L (ref 15–41)
Albumin: 3 g/dL — ABNORMAL LOW (ref 3.5–5.0)
BILIRUBIN TOTAL: 0.4 mg/dL (ref 0.3–1.2)
BUN: 8 mg/dL (ref 6–20)
CALCIUM: 9.5 mg/dL (ref 8.9–10.3)
CO2: 26 mmol/L (ref 22–32)
CREATININE: 0.78 mg/dL (ref 0.61–1.24)
Chloride: 105 mmol/L (ref 101–111)
Glucose, Bld: 83 mg/dL (ref 65–99)
Potassium: 3.8 mmol/L (ref 3.5–5.1)
SODIUM: 138 mmol/L (ref 135–145)
TOTAL PROTEIN: 6.3 g/dL — AB (ref 6.5–8.1)

## 2016-07-26 LAB — DIFFERENTIAL
BASOS ABS: 0 10*3/uL (ref 0.0–0.1)
BASOS PCT: 1 %
Eosinophils Absolute: 0.1 10*3/uL (ref 0.0–0.7)
Eosinophils Relative: 1 %
Lymphocytes Relative: 30 %
Lymphs Abs: 1.8 10*3/uL (ref 0.7–4.0)
Monocytes Absolute: 0.7 10*3/uL (ref 0.1–1.0)
Monocytes Relative: 12 %
NEUTROS ABS: 3.3 10*3/uL (ref 1.7–7.7)
NEUTROS PCT: 56 %

## 2016-07-26 LAB — APTT: APTT: 29 s (ref 24–36)

## 2016-07-26 LAB — PROTIME-INR
INR: 1.13
PROTHROMBIN TIME: 14.6 s (ref 11.4–15.2)

## 2016-07-26 LAB — HIV ANTIBODY (ROUTINE TESTING W REFLEX): HIV Screen 4th Generation wRfx: NONREACTIVE

## 2016-07-26 MED ORDER — IOPAMIDOL (ISOVUE-300) INJECTION 61%
INTRAVENOUS | Status: AC
  Administered 2016-07-26: 75 mL
  Filled 2016-07-26: qty 75

## 2016-07-26 MED ORDER — MAGNESIUM SULFATE 2 GM/50ML IV SOLN
2.0000 g | Freq: Once | INTRAVENOUS | Status: AC
  Administered 2016-07-26: 2 g via INTRAVENOUS
  Filled 2016-07-26: qty 50

## 2016-07-26 MED ORDER — SODIUM CHLORIDE 0.9 % IV BOLUS (SEPSIS)
500.0000 mL | Freq: Two times a day (BID) | INTRAVENOUS | Status: AC
  Administered 2016-07-26 (×2): 500 mL via INTRAVENOUS

## 2016-07-26 NOTE — Progress Notes (Signed)
CSW received consult that pt is from [facility]- spoke with pt and confirmed that he is from rehab and plans to return at DC- received verbal permission to speak with [facility] and send updates as needed.  CSW spoke with Press photographerCharge Nurse at [facility] Dois Davenport(Sandra [phone]) who confirmed pt is able to return when medically stable and that his bed is being saved for him.  Dois DavenportSandra expressed concerns about pt condition and states they are worried it might be something more serious than strep because of pt coughing up blood- warns that pt is not a great advocate for himself and might downplay his symptoms.  CSW will continue to follow to assist with transition back to [facility] when stable (rehab needs pt hemoglobin to be within normal range prior to readmit).  On day of DC CSW will fax updates to rehab (fax [phone]) and [facility] facility.  Jaime SisJenna H. Shailene Demonbreun, LCSW Clinical Social Worker [phone]

## 2016-07-26 NOTE — Progress Notes (Signed)
   Subjective: Patient reports feeling somewhat better today. His throat is still very painful but improved from yesterday. He denies any bleeding. No chest pain, SOB, lightheadedness, or fatigue.   Objective:  Vital signs in last 24 hours: Vitals:   07/25/16 1814 07/25/16 2124 07/26/16 0543 07/26/16 0757  BP: (!) 158/97 (!) 159/93 (!) 149/99 (!) 140/92  Pulse: 73 84 71 79  Resp:  17 16 16   Temp:  99 F (37.2 C) 98.5 F (36.9 C) 98.4 F (36.9 C)  TempSrc:  Oral Oral Oral  SpO2:  100% 100% 100%  Weight:      Height:       Physical Exam Constitutional: NAD, appears comfortable HEENT: Active gingival bleeding, posterior pharynx with mucosal hemorrhage.  Cardiovascular: RRR, no murmurs, rubs, or gallops.  Pulmonary/Chest: CTAB, no wheezes, rales, or rhonchi.   Abdominal: Soft, non tender, non distended. +BS.  Extremities: Warm and well perfused. Distal pulses intact. No edema.  Neurological: A&Ox3, CN II - XII grossly intact.  Skin: Follicular appearing rash on posterior neck and stomach  Psychiatric: Normal mood and affect  Assessment/Plan:  Anemia: Hemoglobin is stable, 7.5 from 7.0 yesterday. Patient continues to have gingival bleeding. He endorses painful swollen gums and was planning to see a dentist soon. Possibly gingivitis, however given his low Hgb I am concerned there is a secondary processes at play. Patient denies family history of any bleeding disorders or sickle cell anemia. Hemophilia is unlikely, patient denies any prior issues with bleeding and had a root canal in the past without complication. Platelets are normal. Patient endorsed a questionable episode of hematemesis recently. With his history of chronic alcohol use, esophogeal varices are a possibility, but RUQ ultrasound was negative for any signs of cirrhosis. Patient also endorses multiple episodes of hemoptysis. CXR on admission was normal.  -- CT chest today  -- Vit C level pending  -- LDH was normal and  haptoglobin is pending  -- HIV antibodies pending  -- Started Protonix 40 mg BID on admission, continue for now  -- ANA pending -- APTT, PT-INR today  -- Peripheral blood smear pending  -- Magnesium low at 1.5, will replete 2 g IV today   Streptococcal Pharyngitis: Recent throat swab culture with group A strep.  -- Amoxicillin 500 mg BID (day 2 / 10)  Rash: Likely folliculitis vs. Drug reaction. Patient recently started on multiple medications at rehab.  -- Med list reviewed  -- Will hold buspar for now; can cause a rash in 1% population  -- Continue to monitor   HTN: -- Hold home amlodipine 5 mg and HCTZ 12.5 mg daily  Alcohol Use Disorder: -- Continue folic acid, thiamine, and multivitamin daily  Depression: -- Hold buspar -- Continue lexapro daily   FEN: No fluids, repleting magnesium, regular diet VTE ppx: SCDs Code Status: FULL  Dispo: Anticipated discharge in approximately 2-3 days.   Jaime Pollarolyn Bernise Sylvain, MD 07/26/2016, 9:43 AM Pager: [phone]

## 2016-07-26 NOTE — Progress Notes (Signed)
Patient presented with long-standing sore throat that grew group A strep following an ED visit on 10/19. No treatment was received and patient now reports a rash on the neck and stomach as well. Upon admission, amoxicillin was initiated for GAS.  A review of outpatient medications was conducted to determine possible cause of rash. Per chart, patient was taking buspirone, escitalopram, thiamine, trazodone, alprazolam, amlodipine, atorvastatin, meclizine, and valsartan prior to admission. Records from rehabilitation center indicates patient recently finished a taper of Librium and is not taking alprazolam.  Of the above medications, alprazolam has incidence of rash, occurring in 10.8% of patients during clinical trials for panic disorder according to the Xanax prescribing information. The package insert for Librium notes skin eruptions and rash as an adverse reaction, but frequency is not specified. In the prescribing information for BuSpar, 1% of patients reported a rash while taking buspirone in clinical trials. Given discontinuation of Librium and alprazolam, buspirone is being held to identify if the rash was drug-induced by buspirone.  Claria Diceachel Sherian Valenza, PharmD Candidate, [employer] CPHS

## 2016-07-27 DIAGNOSIS — R21 Rash and other nonspecific skin eruption: Secondary | ICD-10-CM

## 2016-07-27 DIAGNOSIS — F1021 Alcohol dependence, in remission: Secondary | ICD-10-CM

## 2016-07-27 DIAGNOSIS — J020 Streptococcal pharyngitis: Secondary | ICD-10-CM

## 2016-07-27 DIAGNOSIS — I10 Essential (primary) hypertension: Secondary | ICD-10-CM

## 2016-07-27 DIAGNOSIS — D649 Anemia, unspecified: Secondary | ICD-10-CM | POA: Diagnosis not present

## 2016-07-27 LAB — BASIC METABOLIC PANEL
ANION GAP: 6 (ref 5–15)
BUN: 6 mg/dL (ref 6–20)
CHLORIDE: 106 mmol/L (ref 101–111)
CO2: 27 mmol/L (ref 22–32)
Calcium: 9.3 mg/dL (ref 8.9–10.3)
Creatinine, Ser: 0.73 mg/dL (ref 0.61–1.24)
GFR calc Af Amer: >60 mL/min (ref >60)
GLUCOSE: 81 mg/dL (ref 65–99)
POTASSIUM: 4 mmol/L (ref 3.5–5.1)
Sodium: 139 mmol/L (ref 135–145)

## 2016-07-27 LAB — PROTEIN ELECTROPHORESIS, SERUM
A/G RATIO SPE: 1 (ref 0.7–1.7)
ALBUMIN ELP: 3.5 g/dL (ref 2.9–4.4)
ALPHA-1-GLOBULIN: 0.2 g/dL (ref 0.0–0.4)
ALPHA-2-GLOBULIN: 0.6 g/dL (ref 0.4–1.0)
Beta Globulin: 1 g/dL (ref 0.7–1.3)
GLOBULIN, TOTAL: 3.4 g/dL (ref 2.2–3.9)
Gamma Globulin: 1.6 g/dL (ref 0.4–1.8)
TOTAL PROTEIN ELP: 6.9 g/dL (ref 6.0–8.5)

## 2016-07-27 LAB — ANTINUCLEAR ANTIBODIES, IFA: ANTINUCLEAR ANTIBODIES, IFA: NEGATIVE

## 2016-07-27 LAB — CBC
HEMATOCRIT: 22.5 % — AB (ref 39.0–52.0)
HEMOGLOBIN: 7.5 g/dL — AB (ref 13.0–17.0)
MCH: 32.2 pg (ref 26.0–34.0)
MCHC: 33.3 g/dL (ref 30.0–36.0)
MCV: 96.6 fL (ref 78.0–100.0)
Platelets: 221 10*3/uL (ref 150–400)
RBC: 2.33 MIL/uL — AB (ref 4.22–5.81)
RDW: 14 % (ref 11.5–15.5)
WBC: 6 10*3/uL (ref 4.0–10.5)

## 2016-07-27 LAB — HAPTOGLOBIN: HAPTOGLOBIN: 155 mg/dL (ref 34–200)

## 2016-07-27 LAB — MAGNESIUM: Magnesium: 1.8 mg/dL (ref 1.7–2.4)

## 2016-07-27 MED ORDER — DIPHENHYDRAMINE HCL 25 MG PO CAPS
25.0000 mg | ORAL_CAPSULE | Freq: Four times a day (QID) | ORAL | Status: DC | PRN
  Administered 2016-07-27: 25 mg via ORAL
  Filled 2016-07-27: qty 1

## 2016-07-27 MED ORDER — PANTOPRAZOLE SODIUM 40 MG PO TBEC
40.0000 mg | DELAYED_RELEASE_TABLET | Freq: Every day | ORAL | Status: DC
  Administered 2016-07-28: 40 mg via ORAL
  Filled 2016-07-27: qty 1

## 2016-07-27 NOTE — Care Management Note (Addendum)
Case Management Note  Patient Details  Name: Jaime Hodges MRN: [id number] Date of Birth: 11/08/1970  Subjective/Objective:      Presents with c/o sore throat, hx of HTN and ETOH use. Independent  with ADL's PTA, no DME usage.  PCP: Romeo RabonMichael  Caplan  Action/Plan: Plan is to d/c today.  Expected Discharge Date:    07/27/2016          Expected Discharge Plan:  [facility] ( [facility])  In-House Referral:  Clinical Social Work  Discharge planning Services     Status of Service:  completed  If discussed at [employer] Length of Stay Meetings, dates discussed:    Additional Comments:  Epifanio LeschesCole, Ellicia Alix Hudson, RN 07/27/2016, 10:16 AM

## 2016-07-27 NOTE — Progress Notes (Signed)
   Subjective: Patient was sleeping comfortably this morning on exam. No complaints. He denies any further episodes of hemoptysis.   Objective:  Vital signs in last 24 hours: Vitals:   07/26/16 0757 07/26/16 1253 07/26/16 2111 07/27/16 0659  BP: (!) 140/92 114/85 133/90 130/90  Pulse: 79 78 81 80  Resp: 16  16 18   Temp: 98.4 F (36.9 C) 99.2 F (37.3 C) 99.4 F (37.4 C) 98.5 F (36.9 C)  TempSrc: Oral Oral Oral Oral  SpO2: 100% 100% 100% 100%  Weight:      Height:       Physical Exam Constitutional: NAD, appears comfortable HEENT:Ginvigal bleeding has stopped Cardiovascular: RRR, no murmurs, rubs, or gallops.  Pulmonary/Chest: CTAB, no wheezes, rales, or rhonchi.   Abdominal: Soft, non tender, non distended. +BS.  Skin:Follicular appearing rash on posterior neck and stomach   Assessment/Plan:  Anemia: Normocytic, normochromic. Peripheral smear yesterday was unremarkable and reviewed with a hematologist. Hemoglobin is stable and unchanged from yesterday at 7.5. He remains asymptomatic and denies further episodes of hemoptysis. On further questioning about hemoptysis, bleeding sounds minimal and superficial. He did have some gingival bleeding on presentation, but in the setting of poor dental hygiene and painful gums, I suspect this is periodontal gingivitis and he will need follow up with a dentist. Work up thus far is significant for iron deficiency anemia, s/p IV feraheme in ED. Work up otherwise has been non contributory. CT chest yesterday negative for findings to explain hemoptysis but did note some mild bronchitic changes. RUQ ultrasound negative for cirrhotic findings, esophageal varices is unlikely. FOBT negative x 3. He denies abdominal pain and diarrhea. Etiology is likely iron deficiency anemia related to his chronic alcohol use. Alcoholic gastritis is also a possibility but patient denies abdominal pain and exam is benign. His hemoptysis is likely multifactorial due to his  gingivitis and chronic, untreated strep throat infection x 3 months. Superficial mucosal bleeding was noted on his posterior pharynx on physical exam. Hemoptysis has resolved with antibiotic treatment.  -- Stable and asymptomatic, no indication for transfusion at this time.  -- Daily iron supplements  -- Vit C level pending  -- ANA pending -- SPEP and IFE pending  -- Started Protonix on admission, continue 40 mg daily -- F/u with a dentist for gingivitis  -- Recommend PCP follow up in 4 weeks for CBC recheck   Streptococcal Pharyngitis: Recent throat swab culture with group A strep.  -- Amoxicillin 500 mg BID (day 3 / 10)  Rash: Likely folliculitis vs. Drug reaction. Patient recently started on multiple medications at rehab.  -- Med list reviewed  -- Will hold buspar for now; can cause a rash in 1% population  -- Continue to monitor   HTN: -- Hold home amlodipine 5 mg and HCTZ 12.5 mg daily  Alcohol Use Disorder: -- Continue folic acid, thiamine, and multivitamin daily  Depression: -- Hold buspar -- Continue lexapro daily   FEN: No fluids, repleting magnesium, regular diet VTE ppx: SCDs Code Status: FULL   Dispo: Anticipated discharge today [facility].   Reymundo Pollarolyn Mckynna Vanloan, MD 07/27/2016, 9:00 AM Pager: [phone]

## 2016-07-28 DIAGNOSIS — K852 Alcohol induced acute pancreatitis without necrosis or infection: Secondary | ICD-10-CM

## 2016-07-28 DIAGNOSIS — J020 Streptococcal pharyngitis: Secondary | ICD-10-CM | POA: Diagnosis not present

## 2016-07-28 DIAGNOSIS — D649 Anemia, unspecified: Secondary | ICD-10-CM | POA: Diagnosis not present

## 2016-07-28 LAB — IMMUNOFIXATION ELECTROPHORESIS
IGG (IMMUNOGLOBIN G), SERUM: 1556 mg/dL (ref 700–1600)
IgA: 288 mg/dL (ref 90–386)
IgM, Serum: 124 mg/dL (ref 20–172)
TOTAL PROTEIN ELP: 6.9 g/dL (ref 6.0–8.5)

## 2016-07-28 LAB — CBC
HEMATOCRIT: 23.6 % — AB (ref 39.0–52.0)
Hemoglobin: 7.9 g/dL — ABNORMAL LOW (ref 13.0–17.0)
MCH: 32.2 pg (ref 26.0–34.0)
MCHC: 33.5 g/dL (ref 30.0–36.0)
MCV: 96.3 fL (ref 78.0–100.0)
Platelets: 256 10*3/uL (ref 150–400)
RBC: 2.45 MIL/uL — ABNORMAL LOW (ref 4.22–5.81)
RDW: 13.8 % (ref 11.5–15.5)
WBC: 6.3 10*3/uL (ref 4.0–10.5)

## 2016-07-28 LAB — VITAMIN C: Vitamin C: 1.5 mg/dL (ref 0.2–2.0)

## 2016-07-28 MED ORDER — BUSPIRONE HCL 15 MG PO TABS
7.5000 mg | ORAL_TABLET | Freq: Two times a day (BID) | ORAL | Status: DC
  Administered 2016-07-28: 7.5 mg via ORAL
  Filled 2016-07-28: qty 1

## 2016-07-28 MED ORDER — TRIAMCINOLONE ACETONIDE 0.1 % EX CREA
TOPICAL_CREAM | Freq: Two times a day (BID) | CUTANEOUS | Status: DC
  Administered 2016-07-28: 18:00:00 via TOPICAL
  Filled 2016-07-28: qty 15

## 2016-07-28 MED ORDER — AMOXICILLIN 500 MG PO CAPS: 500.0000 mg | ORAL_CAPSULE | Freq: Two times a day (BID) | ORAL | 0 refills | Status: DC

## 2016-07-28 MED ORDER — FERROUS SULFATE 325 (65 FE) MG PO TABS
325.0000 mg | ORAL_TABLET | Freq: Every day | ORAL | Status: DC
  Filled 2016-07-28: qty 1

## 2016-07-28 MED ORDER — FERROUS SULFATE 325 (65 FE) MG PO TABS: 325.0000 mg | ORAL_TABLET | Freq: Every day | ORAL | 3 refills | Status: DC

## 2016-07-28 MED ORDER — FERROUS SULFATE 325 (65 FE) MG PO TABS: 325.0000 mg | ORAL_TABLET | Freq: Every day | ORAL | 3 refills | Status: AC

## 2016-07-28 NOTE — Progress Notes (Signed)
Patient will DC to: [facility] Anticipated DC date: 07/28/16 Family notified: Pt alerting them Transport by: [facility]   Per MD patient ready for [facility] to [facility]. RN, patient, patient's family, and facility notified of DC. Discharge Summary sent to facility.    CSW signing off.  Cristobal GoldmannNadia Iysis Germain, [place] Clinical Social Worker [phone]

## 2016-07-28 NOTE — Progress Notes (Signed)
Jaime HaileyAndre Nazari to be D/C'd Home per MD order.  Discussed with the patient and all questions fully answered.  VSS, Skin clean, dry and intact without evidence of skin break down, no evidence of skin tears noted. IV catheter discontinued intact. Site without signs and symptoms of complications. Dressing and pressure applied.  An After Visit Summary was printed and given to the patient. Patient received prescription.  D/c education completed with patient/family including follow up instructions, medication list, d/c activities limitations if indicated, with other d/c instructions as indicated by MD - patient able to verbalize understanding, all questions fully answered.   Patient instructed to return to ED, call 911, or call MD for any changes in condition.   [facility] and D/C rehab via their escort services  L'ESPERANCE, [name] C 07/28/2016 5:30 PM

## 2016-07-28 NOTE — Consult Note (Signed)
[facility] Consult: 10:02 AM 07/28/2016  LOS: 1 day    Referring Provider: Dr Criselda Peaches  Primary Care Physician:  Romeo Rabon, MD who is in [facility] Primary Gastroenterologist:  unassigned     Reason for Consultation:  Anemia.. Normocytic.  FOBT negative.   HPI: Jaime Hodges is a 45 y.o. male.  Patient with history of depression, anxiety. Alcoholism.  No known liver disease. Anemia. HTN.  Tremor. Has never seen a GI specialist nor had upper endoscopy or colonoscopy.  [facility] entered [facility] for alcohol rehabilitation and detox. He was seen on 07/20/16 in the [facility] ED with a sodium of 129.  He was given a liter of normal saline and his sodium went up to 134.  He also had a lipase of 1267, minor elevation of AST at 45 and normal alkaline phosphatase and total bilirubin. Additionally his hemoglobin was 7.5 with MCV of 94. Platelets were normal.  FOBT testing was negative. At that time he did not undergo any gastrointestinal imaging. He returned to [facility].  Has not had any problems with his detox at [facility].  He was readmitted from [facility] 10/25 because of hemoptysis. He's had a sore throat for at least 3 weeks. This is not relieved with salt water gargles or lozenges.  His nurse practitioner, primary care provider in [place] told him that his blood counts were low about 3 weeks ago but he doesn't have any details on this.  About 2-1/2 weeks ago he had an incidence of syncope after getting up in the morning and reports isolated incidences of orthostatic hypotension where he gets dizzy if he stands up. His appetite is generally poor 4 months. He remembers waking 230 pounds a year ago. Weight 136 pounds when he entered [facility] 10 days ago and  within a few days of being near his appetite improved and his weight went up to 156 pounds. Doesn't have heartburn. No use of NSAIDs.  He feels like he chokes when he eats. The hemoptysis started yesterday.  For several months the patient has had 5 or 6 watery, not bloody, brown stools. Since he went to [facility] and has started eating again the stools have firmed up but they're now black in color.  He is FOBT negative.  CT chest shows borderline bronchial airway thickening, 5 mm right lower lobe lung nodule as well as smaller peripheral pulmonary nodules which will need repeat CT scan in 12 months. The visualized portion of the upper abdomen was unremarkable. Abdominal ultrasound is unremarkable.  No problems with the liver. CBD 2 mm.  Patient is unaware of any family history of anemias, sickle cell disease or trait. He has never required blood transfusion. He does not have unusual bleeding or bruising.  Past Medical History:  Diagnosis Date  . Alcohol abuse   . Anemia   . Anxiety   . Depression   . High cholesterol   . Hypertension   . Low sodium levels   . Pneumonia 1990s   walking pneumonia  X 1    Past Surgical History:  Procedure Laterality Date  . ABSCESS DRAINAGE  ~ 2014   NECK    Prior to Admission medications   Medication Sig Start Date End Date Taking? Authorizing Provider  busPIRone (BUSPAR) 7.5 MG tablet Take 7.5 mg by mouth 2 (two) times daily. 05/18/16  Yes Historical Provider, MD  escitalopram (LEXAPRO) 10 MG tablet Take 10 mg by mouth daily. 05/23/16  Yes Historical Provider, MD  guaiFENesin (MUCINEX) 600 MG 12 hr tablet Take 600 mg by mouth 2 (two) times daily as needed for cough.   Yes Historical Provider, MD  Multiple Vitamin (MULTIVITAMIN WITH MINERALS) TABS tablet Take 1 tablet by mouth daily.   Yes Historical Provider, MD  thiamine 100 MG tablet Take 100 mg by mouth daily.   Yes Historical Provider, MD  traZODone (DESYREL) 50 MG tablet Take 50 mg by mouth  at bedtime. 07/22/16 07/26/16 Yes Historical Provider, MD  ALPRAZolam Prudy Feeler) 0.25 MG tablet Take 0.25 mg by mouth 2 (two) times daily. 04/26/16   Historical Provider, MD  amLODipine (NORVASC) 5 MG tablet Take 1 tablet by mouth daily. 06/30/16   Historical Provider, MD  atorvastatin (LIPITOR) 40 MG tablet Take 40 mg by mouth daily. 04/27/16   Historical Provider, MD  meclizine (ANTIVERT) 25 MG tablet Take 1 tablet by mouth as needed for dizziness. 06/30/16   Historical Provider, MD  valsartan (DIOVAN) 320 MG tablet Take 320 mg by mouth daily. 04/26/16   Historical Provider, MD    Scheduled Meds: . amoxicillin  500 mg Oral Q12H  . escitalopram  10 mg Oral Daily  . feeding supplement  1 Container Oral BID BM  . folic acid  1 mg Oral Daily  . multivitamin with minerals  1 tablet Oral Daily  . pantoprazole  40 mg Oral Daily  . thiamine  100 mg Oral Daily   Infusions:   PRN Meds: diphenhydrAMINE   Allergies as of 07/25/2016 - Review Complete 07/25/2016  Allergen Reaction Noted  . Aspirin Other (See Comments) 07/20/2016    History reviewed. No pertinent family history.  Social History   Social History  . Marital status: Married    Spouse name: N/A  . Number of children: N/A  . Years of education: N/A   Occupational History  . Not on file.   Social History Main Topics  . Smoking status: Current Every Day Smoker    Packs/day: 0.00    Years: 13.00    Types: Cigars  . Smokeless tobacco: Never Used     Comment: "Black N Milds"  . Alcohol use 25.2 oz/week    42 Cans of beer per week     Comment: 07/25/2016 "6 pack/day"  . Drug use: No  . Sexual activity: Not on file   Other Topics Concern  . Not on file   Social History Narrative  . No narrative on file    REVIEW OF SYSTEMS: Constitutional:  See HPI ENT:  No nose bleeds Pulm:  No DOE. Hemoptysis as per history of present illness. CV:  No palpitations, no LE edema. No anginal symptoms. GU:  No hematuria, no  frequency GI:  See HPI Heme:  See HPI   Transfusions:  None ever Neuro:  No headaches, no peripheral tingling or numbness.  Has had a tremor for a long time involving his head and arms. This gets especially noticeable when he is anxious. Psych:  Anxiety. Derm:  No itching, no rash or sores.  Endocrine:  No sweats or chills.  No polyuria or dysuria Immunization:  Did not inquire    PHYSICAL EXAM: Vital signs in last 24 hours: Vitals:   07/27/16 2246 07/28/16 0522  BP: 128/78 131/87  Pulse: 77 73  Resp: 18 18  Temp: 98.7 F (37.1 C) 98.6 F (37 C)   Wt Readings from Last 3 Encounters:  07/25/16 72.7 kg (160 lb 4.8 oz)  07/20/16 66.2 kg (146 lb)    General: Thin, not acutely ill-appearing, alert, comfortable AAM. Head:  No facial asymmetry or swelling. No signs of head trauma.  Eyes:  No scleral icterus or conjunctival pallor. Ears:  No conjunctival pallor. No scleral icterus.  Nose:  No discharge or congestion. Mouth:  No oral lesions. Mucous membranes are moist and clear. Tongue midline. Dentition fair. Neck:  No JVD, no masses, no thyromegaly. Lungs:  Clear bilaterally. No cough. No dyspnea. Heart: RRR. No MRG. S1, S2 present. Abdomen:  Soft. Nontender. Not distended. No masses. No HSM.Marland Kitchen.   Rectal: Deferred. He was FOBT negative on 10/19 and 10/24. Musc/Skeltl: No joint swelling, deformities or erythema. Extremities:  No CCE.  Neurologic:  Alert. Fully oriented. Good historian. Tremor of the hands and head/upper body. No asterixis. Limb strength is full. Skin:  No rashes, sores or suspicious lesions Psych:  Quiet demeanor. Cooperative. Not obviously depressed. Mildly anxious.  Intake/Output from previous day: 10/26 0701 - 10/27 0700 In: 480 [P.O.:480] Out: 1500 [Urine:1500] Intake/Output this shift: No intake/output data recorded.  LAB RESULTS:  Recent Labs  07/26/16 0753 07/27/16 0509 07/28/16 0633  WBC 6.7 6.0 6.3  HGB 7.5* 7.5* 7.9*  HCT 22.9* 22.5*  23.6*  PLT 206 221 256   BMET Lab Results  Component Value Date   NA 139 07/27/2016   NA 138 07/26/2016   NA 140 07/25/2016   K 4.0 07/27/2016   K 3.8 07/26/2016   K 4.0 07/25/2016   CL 106 07/27/2016   CL 105 07/26/2016   CL 108 07/25/2016   CO2 27 07/27/2016   CO2 26 07/26/2016   CO2 29 07/25/2016   GLUCOSE 81 07/27/2016   GLUCOSE 83 07/26/2016   GLUCOSE 130 (H) 07/25/2016   BUN 6 07/27/2016   BUN 8 07/26/2016   BUN 11 07/25/2016   CREATININE 0.73 07/27/2016   CREATININE 0.78 07/26/2016   CREATININE 0.77 07/25/2016   CALCIUM 9.3 07/27/2016   CALCIUM 9.5 07/26/2016   CALCIUM 9.8 07/25/2016   LFT  Recent Labs  07/26/16 0753  PROT 6.3*  ALBUMIN 3.0*  AST 32  ALT 31  ALKPHOS 51  BILITOT 0.4   PT/INR Lab Results  Component Value Date   INR 1.13 07/26/2016   INR 1.07 07/25/2016   Hepatitis Panel No results for input(s): HEPBSAG, HCVAB, HEPAIGM, HEPBIGM in the last 72 hours. C-Diff No components found for: CDIFF Lipase     Component Value Date/Time   LIPASE 375 (H) 07/25/2016 1234    Drugs of Abuse  No results found for: LABOPIA, COCAINSCRNUR, LABBENZ, AMPHETMU, THCU, LABBARB   RADIOLOGY STUDIES: Ct Chest W Contrast  Result Date: 07/26/2016 CLINICAL DATA:  Persistent cough. Hemoptysis. Current everyday smoker. EXAM: CT CHEST WITH CONTRAST TECHNIQUE: Multidetector CT imaging of the chest was performed during intravenous contrast administration. CONTRAST:  75mL ISOVUE-300 IOPAMIDOL (ISOVUE-300) INJECTION 61% COMPARISON:  None. FINDINGS: Cardiovascular: No significant vascular findings. Normal heart size. No pericardial effusion. Mediastinum/Nodes: No adenopathy or mass. Lungs/Pleura: Mild dependent atelectasis. Borderline diffuse airway thickening. No mass,  cavity, or bronchiectasis. 5 mm average diameter pulmonary nodule in the right lower lobe, 2:99. There are other smaller peripheral pulmonary nodules. Upper Abdomen: Negative Musculoskeletal: No acute or  aggressive finding. IMPRESSION: 1. No specific explanation for hemoptysis. 2. Borderline bronchitic airway thickening diffusely. 3. 5 mm right lower lobe pulmonary nodule. Non-contrast chest CT can be considered in 12 months in this high risk patient. This recommendation follows the consensus statement: Guidelines for Management of Incidental Pulmonary Nodules Detected on CT Images: From the Fleischner Society 2017; Radiology 2017; 284:228-243. Electronically Signed   By: Marnee Spring M.D.   On: 07/26/2016 12:35     IMPRESSION:   *  Normocytic anemia.  FOBT negative 2.  Total iron is low. The iron binding capacity is normal and iron saturation is low. Ferritin normal.  B12, folate normal.  *  Pancreatitis. Multiple GI symptoms including anorexia, nausea and vomiting, diarrhea but not a lot of abdominal pain. Lipase normalizing. 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 Ultrasound abdomen unremarkable.  *  Several months of anorexia and significant weight loss.  Suspect this is related to his alcoholism and is improving now that he has been sober.  *  Several months of watery stools, now improved so now black in color.  However he is FOBT negative 2 within the last 5 days.  *  Hemoptysis. Nonspecific pulmonary nodules on CT scan.    PLAN:     *  No plans for endoscopy right now so we will let him eat.   Jennye Moccasin  07/28/2016, 10:02 AM Pager: [phone]   I have reviewed the entire case in detail with the above APP and discussed the plan in detail.  Therefore, I agree with the diagnoses recorded above. In addition,  I have personally interviewed and examined the patient and have personally reviewed any abdominal/pelvic CT scan images.  My additional thoughts are as follows:  There is no evidence that this patient is anemic from GI blood loss    His dyspepsia is steadily improving the further he gets away from EtOH Pancreatitis improved  I have no plans for an EGD on him, and I think he  can be safely discharged from a GI perspective   Charlie Pitter III Pager [phone]  Mon-Fri 8a-5p [phone] after 5p, weekends, holidays

## 2016-07-28 NOTE — Progress Notes (Signed)
   Subjective: Patient is doing well this morning. Sore throat and odynophagia continue to improve with antibiotics. No complaints today.   Objective:  Vital signs in last 24 hours: Vitals:   07/27/16 0659 07/27/16 1429 07/27/16 2246 07/28/16 0522  BP: 130/90 121/76 128/78 131/87  Pulse: 80 83 77 73  Resp: '18  18 18  '$ Temp: 98.5 F (36.9 C) 99.1 F (37.3 C) 98.7 F (37.1 C) 98.6 F (37 C)  TempSrc: Oral Oral Oral Oral  SpO2: 100% 98% 99% 99%  Weight:      Height:        Physical Exam Constitutional: NAD, appears comfortable HEENT: Poor dental hygiene, but ginvigal bleeding has stopped Cardiovascular:RRR, no murmurs, rubs, or gallops.  Pulmonary/Chest:CTAB, no wheezes, rales, or rhonchi.  Abdominal:Soft, non tender, non distended. +BS.  Skin:Follicular appearing rash on posterior neck and stomach   Assessment/Plan:  Anemia: Normocytic and normochromic. Hemodynamically stable, hgb unchanged at 7.9 today. Patient continues to be asymptomatic. Etiology is likely iron deficiency anemia related to poor nutrition and chronic alcohol use vs alcoholic gastritis. Multiple myeloma or a myelodysplastic disorder is also a possibility, however peripheral smear was normal. Patient reports significant weight loss over the past 1.5 years while drinking heavily due to poor PO intake, but has started to gain weight back since he stopped drinking. He reports a good appetite and is eating well here. No abdominal pain, no N/V. On ROS today patient endorsed some vague, remote complaints of near syncope / dizziness as well as a few episodes of darks stool, but describes the stool as dark brown in color. He denies any overt blood in his stool or having dark melenotic stools. Will consult GI today to ensure there is no need for urgent endoscopy.  -- Peripheral smear negative (reviewed by hematology) -- SPEP normal; IFE pending -- Plan for outpatient bone marrow biopsy per IR  -- GI consult; appreciate  recs  -- Continue Protonix 40 mg daily  -- Daily iron supplements   Streptococcal Pharyngitis: Recent throat swab culture with group A strep.  -- Amoxicillin 500 mg BID (day 4 / 10)  Rash: Likely folliculitis vs. Drug reaction. Patient recently started on multiple medications at rehab. Buspar was initially held but with no change in rash.  -- Med list reviewed  -- Buspar held with no change in rash; will restart -- Continue to monitor  -- Benadryl prn for itching   HTN: -- Hold home amlodipine 5 mg and HCTZ 12.5 mg daily  Alcohol Use Disorder: -- Continue folic acid, thiamine, and multivitamin daily  Depression: -- Continue lexapro and buspar daily   FEN: No fluids, repleting magnesium, regular diet VTE ppx: SCDs Code Status: FULL  Dispo: Anticipated discharge pending GI consult recommendations and [facility].   Velna Ochs, MD 07/28/2016, 11:26 AM Pager: [phone]

## 2016-07-28 NOTE — Care Management Note (Signed)
Case Management Note  Patient Details  Name: Blair Haileyndre Brule MRN: [id number] Date of Birth: 11/17/1970  Subjective/Objective:                    Action/Plan: GI consult pending...Marland Kitchen.Marland Kitchen.Marland Kitchen. Plan is to d/c back to [facility] @ d/c....Marland Kitchen.CSW following/ monitoring disposition.  No presents needs identified per CM.  Expected Discharge Date:    07/29/2016            Expected Discharge Plan:  Home/Self Care ([facility]  In-House Referral:  Clinical Social Work  Discharge planning Services     Post Acute Care Choice:    Choice offered to:     DME Arranged:    DME Agency:     HH Arranged:    HH Agency:     Status of Service:  In process, will continue to follow  If discussed at Long Length of Stay Meetings, dates discussed:    Additional Comments:  Epifanio LeschesCole, Alois Colgan Hudson, RN 07/28/2016, 2:47 PM

## 2016-07-28 NOTE — Discharge Summary (Signed)
Name: Jaime Hodges MRN: [id number] DOB: [birth year] 45 y.o. PCP: Jaime Cipro, MD  Date of Admission: 07/25/2016 11:25 AM Date of Discharge: 07/28/2016 Attending Physician: Jaime Falcon, MD  Discharge Diagnosis: 1. Strep Pharyngitis  2. Anemia   Discharge Medications:   Medication List    TAKE these medications   ALPRAZolam 0.25 MG tablet Commonly known as:  XANAX Take 0.25 mg by mouth 2 (two) times daily.   amLODipine 5 MG tablet Commonly known as:  NORVASC Take 1 tablet by mouth daily.   amoxicillin 500 MG capsule Commonly known as:  AMOXIL Take 1 capsule (500 mg total) by mouth every 12 (twelve) hours.   atorvastatin 40 MG tablet Commonly known as:  LIPITOR Take 40 mg by mouth daily.   busPIRone 7.5 MG tablet Commonly known as:  BUSPAR Take 7.5 mg by mouth 2 (two) times daily.   escitalopram 10 MG tablet Commonly known as:  LEXAPRO Take 10 mg by mouth daily.   ferrous sulfate 325 (65 FE) MG tablet Take 1 tablet (325 mg total) by mouth at bedtime.   guaiFENesin 600 MG 12 hr tablet Commonly known as:  MUCINEX Take 600 mg by mouth 2 (two) times daily as needed for cough.   meclizine 25 MG tablet Commonly known as:  ANTIVERT Take 1 tablet by mouth as needed for dizziness.   multivitamin with minerals Tabs tablet Take 1 tablet by mouth daily.   thiamine 100 MG tablet Take 100 mg by mouth daily.   traZODone 50 MG tablet Commonly known as:  DESYREL Take 50 mg by mouth at bedtime.   valsartan 320 MG tablet Commonly known as:  DIOVAN Take 320 mg by mouth daily.       Disposition and follow-up:   Mr.Jaime Hodges was discharged from [facility] in Stable condition.  At the hospital follow up visit please address:  1.  Anemia: Patient should be contacted to schedule an appointment with IR for CT guided bone marrow biopsy. Please ensure patient has appropriate follow up scheduled.   2. Lung Nodule: 5 mm right lower lobe nodule  found incidentally on CT chest. Recommend follow up in 12 months per radiology.   3. Gingivitis: Dental follow up   4.  Labs / imaging needed at time of follow-up: CBC  5.  Pending labs/ test needing follow-up: Vitamin C level, IFE   Follow-up Appointments: Follow-up Information    Jaime Cipro, MD. Go on 08/28/2016.   Specialty:  Internal Medicine Why:  at 11:00 am for hospital follow up  Contact information: [address] [place] [address] [phone]        [facility]. Schedule an appointment as soon as possible for a visit today.   Specialty:  Gastroenterology Why:  Please call and make an appointment in the next 2-4 weeks  Contact information: [place] [phone] [place] [facility] Course by problem list:  1. Anemia: Patient was found to have a hemoglobin of 7.0 on admission, stable from his ER visit 4 days prior. Patient was asymptomatic. He endorsed some mild hemoptysis and one questionable episode of hematemesis after eating ravioli, but ROS was otherwise negative. He denied feeling light headed, dizzy, or SOB. He did report significant weight loss over the past 1-2 years due to his heavy alcohol use and poor po intake, but since becoming sober has actually started to gain weight. He denied  nausea, vomiting, and abdominal pain. He reported a good appetite and ate well without difficulty while in the hospital. He denied early satiety. Patient says after starting rehab he was having multiple episodes of loose stool a day (5-6x) when he first started to eat. Over the past few days his stools have begun to normalize, becoming more formed and less frequent, now 2-3 x a day. He denied any blood in his stool or having any dark melanotic stool. FOBT was negative x 2. Given his history of chronic, heavy alcohol use and his questional episode of hematemesis, there was initially concern for esophageal varices.  But abdominal ultrasound was negative for any cirrhotic changes and PT and INR were normal. LTFs were also normal, however this does not rule out cirrhosis. Overall, patient underwent extensive work up that was significant only for iron deficiency anemia. He received IV feraheme in the ED and was started on daily iron supplements. Vitamin B12 and folate were normal. Vitamin C level was checked due to gingival bleeding and history of poor nutrition, but results were pending on discharge. Chest CT was negative for any findings to explain hemoptysis, noting only some mild bronchitic changes, and an incidental 5 mm right lower lobe lung nodule. UA was negative for hematuria and patient denied any blood in his urine. HIV antibodies were non reactive. LDH and haptoglobin were both within normal range making hemolysis unlikely. ANA antibodies were negative and SPEP was negative as well. IFE pending on discharge. Peripheral blood smear was normal with normochromic RBCs (reviewed by a hematologist). Hemoglobin remained stable throughout his hospitalization at 7.5. GI was consulted who felt there was no indication for urgent endoscopy at this time. Recommend outpatient follow up for further GI work up. IR was consulted as well for bone marrow biopsy, but recommended scheduling for outpatient as patient was stable. Etiology is likely iron deficiency anemia related to poor nutrition and chronic alcohol use vs. alcoholic gastritis. But again, patient is asymptomatic from a GI standpoint and eating well. Multiple myeloma or a myelodysplastic disorder is also a possibility, but less likely given his normal peripheral smear. Recommend follow up CBC, daily iron supplementation, and outpatient bone marrow biopsy.   2. Streptococcal Pharyngitis: Patient presented with chief complaint of sore throat and odynophagia for three months. He was seen just a few days prior to presentation in the ER for hyponatremia and rapid strep test was  negative at that time. Cultures later grew group A strep but patient was unable to be reached by telephone and he never received treatment. On ROS he endorsed some mild hemoptysis and he was noted to have some superficial mucosal hemorrhage of his posterior pharynx on exam. Chest CT noted some mild bronchitic changes and an incidental lung nodule but otherwise was non concerning. Hemoptysis and sore throat quickly improve with amoxicillin treatment. He was discharged with a prescription to complete a 10 day course.   3. Gingivitis: Patient was noted to have mild gingival bleeding on presentation that persisted for one day. Overall dental hygiene was poor and patient endorsed painful and swollen gums. Vitamin C level was ordered due to his history of poor nutrition but result was pending on discharge. Patient will need follow up with a dentist.   Discharge Vitals:   BP 111/74 (BP Location: Right Arm)   Pulse 78   Temp 98.6 F (37 C) (Oral)   Resp 18   Ht _0  (1.651 m)   Wt 160 lb 4.8 oz (  72.7 kg)   SpO2 100%   BMI 26.68 kg/m   Pertinent Labs, Studies, and Procedures:   07/25/2016 Abdominal Ultrasound: IMPRESSION: Unremarkable abdominal ultrasound.  07/26/2016 Chest CT: IMPRESSION: 1. No specific explanation for hemoptysis. 2. Borderline bronchitic airway thickening diffusely. 3. 5 mm right lower lobe pulmonary nodule. Non-contrast chest CT can be considered in 12 months in this high risk patient. This recommendation follows the consensus statement: Guidelines for Management of Incidental Pulmonary Nodules Detected on CT Images: From the Fleischner Society 2017; Radiology 2017; 284:228-243.  Labs:   Ref. Range 07/25/2016 12:34 07/26/2016 07:53 07/27/2016 05:09 07/28/2016 06:33  WBC Latest Ref Range: 4.0 - 10.5 K/uL 5.7 6.7 6.0 6.3  RBC Latest Ref Range: 4.22 - 5.81 MIL/uL 2.19 (L) 2.36 (L) 2.33 (L) 2.45 (L)  Hemoglobin Latest Ref Range: 13.0 - 17.0 g/dL 7.0 (L) 7.5 (L) 7.5 (L)  7.9 (L)  HCT Latest Ref Range: 39.0 - 52.0 % 21.6 (L) 22.9 (L) 22.5 (L) 23.6 (L)  MCV Latest Ref Range: 78.0 - 100.0 fL 98.6 97.0 96.6 96.3  MCH Latest Ref Range: 26.0 - 34.0 pg 32.0 31.8 32.2 32.2  MCHC Latest Ref Range: 30.0 - 36.0 g/dL 32.4 32.8 33.3 33.5  RDW Latest Ref Range: 11.5 - 15.5 % 14.1 13.9 14.0 13.8  Platelets Latest Ref Range: 150 - 400 K/uL 171 206 221 256    Ref. Range 07/26/2016 07:53  Sodium Latest Ref Range: 135 - 145 mmol/L 138  Potassium Latest Ref Range: 3.5 - 5.1 mmol/L 3.8  Chloride Latest Ref Range: 101 - 111 mmol/L 105  CO2 Latest Ref Range: 22 - 32 mmol/L 26  BUN Latest Ref Range: 6 - 20 mg/dL 8  Creatinine Latest Ref Range: 0.61 - 1.24 mg/dL 0.78  Calcium Latest Ref Range: 8.9 - 10.3 mg/dL 9.5  EGFR (Non-African Amer.) Latest Ref Range: >60 mL/min >60  EGFR (African American) Latest Ref Range: >60 mL/min >60  Glucose Latest Ref Range: 65 - 99 mg/dL 83  Anion gap Latest Ref Range: 5 - 15  7  Alkaline Phosphatase Latest Ref Range: 38 - 126 U/L 51  Albumin Latest Ref Range: 3.5 - 5.0 g/dL 3.0 (L)  AST Latest Ref Range: 15 - 41 U/L 32  ALT Latest Ref Range: 17 - 63 U/L 31  Total Protein Latest Ref Range: 6.5 - 8.1 g/dL 6.3 (L)  Total Bilirubin Latest Ref Range: 0.3 - 1.2 mg/dL 0.4   Urinalysis:  Ref. Range 07/25/2016 15:43  Appearance Latest Ref Range: CLEAR  CLEAR  Bilirubin Urine Latest Ref Range: NEGATIVE  NEGATIVE  Color, Urine Latest Ref Range: YELLOW  YELLOW  Glucose Latest Ref Range: NEGATIVE mg/dL NEGATIVE  Hgb urine dipstick Latest Ref Range: NEGATIVE  NEGATIVE  Ketones, ur Latest Ref Range: NEGATIVE mg/dL NEGATIVE  Leukocytes, UA Latest Ref Range: NEGATIVE  NEGATIVE  Nitrite Latest Ref Range: NEGATIVE  NEGATIVE  pH Latest Ref Range: 5.0 - 8.0  7.0  Protein Latest Ref Range: NEGATIVE mg/dL NEGATIVE  Specific Gravity, Urine Latest Ref Range: 1.005 - 1.030  1.016     Ref. Range 07/25/2016 13:57  Iron Latest Ref Range: 45 - 182 ug/dL 39 (L)    UIBC Latest Units: ug/dL 254  TIBC Latest Ref Range: 250 - 450 ug/dL 293  Saturation Ratios Latest Ref Range: 17.9 - 39.5 % 13 (L)  Ferritin Latest Ref Range: 24 - 336 ng/mL 335  Folate Latest Ref Range: >5.9 ng/mL 19.2    Ref. Range 07/25/2016 13:57  RBC. Latest Ref Range: 4.22 - 5.81 MIL/uL 2.15 (L)  Retic Ct Pct Latest Ref Range: 0.4 - 3.1 % 3.0  Retic Count, Manual Latest Ref Range: 19.0 - 186.0 K/uL 64.5    Ref. Range 07/26/2016 10:33  Prothrombin Time Latest Ref Range: 11.4 - 15.2 seconds 14.6  INR Unknown 1.13    Ref. Range 07/26/2016 10:33  APTT Latest Ref Range: 24 - 36 seconds 29    Ref. Range 07/25/2016 20:27  Haptoglobin Latest Ref Range: 34 - 200 mg/dL 155    Ref. Range 07/25/2016 20:27  LDH Latest Ref Range: 98 - 192 U/L 132   SPEP:  Ref. Range 07/26/2016 17:30  Total Protein ELP Latest Ref Range: 6.0 - 8.5 g/dL 6.9  Albumin ELP Latest Ref Range: 2.9 - 4.4 g/dL 3.5  Globulin, Total Latest Ref Range: 2.2 - 3.9 g/dL 3.4  A/G Ratio Latest Ref Range: 0.7 - 1.7  1.0  Alpha-1-Globulin Latest Ref Range: 0.0 - 0.4 g/dL 0.2  Alpha-2-Globulin Latest Ref Range: 0.4 - 1.0 g/dL 0.6  Beta Globulin Latest Ref Range: 0.7 - 1.3 g/dL 1.0  Gamma Globulin Latest Ref Range: 0.4 - 1.8 g/dL 1.6  M-SPIKE, % Latest Ref Range: Not Observed g/dL Not Observed  SPE Interp.  The SPE pattern appears essentially unremarkable. Evidence of  monoclonal protein is not apparent.   Comment  Protein electrophoresis scan will follow via computer, mail, or  courier delivery.     Ref. Range 07/26/2016 10:33  ANA Ab, IFA Unknown Negative     Discharge Instructions: Discharge Instructions    Call MD for:  difficulty breathing, headache or visual disturbances    Complete by:  As directed    Call MD for:  extreme fatigue    Complete by:  As directed    Call MD for:  persistant dizziness or light-headedness    Complete by:  As directed    Call MD for:  persistant nausea and vomiting    Complete  by:  As directed    Diet - low sodium heart healthy    Complete by:  As directed    Discharge instructions    Complete by:  As directed    Please continue taking all of your medicines as previously prescribed. You will need to take your antibiotic, amoxicillin, once this evening and then twice a day for the next 6 days. You have also been started on a daily iron pill. Please take this every night before bed. Our interventional radiology department will call you to schedule your bone marrow biopsy. You will need to call GI to schedule an appointment in the next 2-4 weeks. Please follow up with your primary care Jaime for your scheduled appointment in November. It was a pleasure taking care of you. Thank you!   Increase activity slowly    Complete by:  As directed       Signed: Velna Ochs, MD 07/28/2016, 4:09 PM   Pager: [phone]

## 2016-07-29 ENCOUNTER — Other Ambulatory Visit: Payer: Self-pay | Admitting: Internal Medicine

## 2016-07-29 DIAGNOSIS — D649 Anemia, unspecified: Secondary | ICD-10-CM

## 2016-08-15 ENCOUNTER — Telehealth (HOSPITAL_BASED_OUTPATIENT_CLINIC_OR_DEPARTMENT_OTHER): Payer: Self-pay | Admitting: Emergency Medicine

## 2016-08-15 NOTE — Telephone Encounter (Signed)
LOST TO FOLLOWUP 

## 2016-09-20 ENCOUNTER — Other Ambulatory Visit: Payer: Self-pay | Admitting: Internal Medicine

## 2016-09-20 DIAGNOSIS — D649 Anemia, unspecified: Secondary | ICD-10-CM

## 2016-09-20 NOTE — Progress Notes (Unsigned)
Received message that prior discharge order for bone marrow biopsy needed to be changed to CT guided biopsy in order to be scheduled. Order was changed.

## 2017-07-18 ENCOUNTER — Other Ambulatory Visit: Payer: Self-pay | Admitting: Orthopaedic Surgery

## 2017-07-18 DIAGNOSIS — M4726 Other spondylosis with radiculopathy, lumbar region: Secondary | ICD-10-CM

## 2017-07-30 ENCOUNTER — Ambulatory Visit
Admission: RE | Admit: 2017-07-30 | Discharge: 2017-07-30 | Disposition: A | Discharge status: Home / Self Care | Payer: BLUE CROSS/BLUE SHIELD | Source: Ambulatory Visit | Attending: Orthopaedic Surgery | Admitting: Orthopaedic Surgery

## 2017-07-30 DIAGNOSIS — M4726 Other spondylosis with radiculopathy, lumbar region: Secondary | ICD-10-CM

## 2018-03-04 IMAGING — MR MR LUMBAR SPINE W/O CM
5 series · 46 of 48 positions shown · non-contrast
Comparison: None.

CLINICAL DATA: Low back pain and left leg pain with weakness and
numbness in the left leg and foot.

EXAM:
MRI LUMBAR SPINE WITHOUT CONTRAST
TECHNIQUE: Multiplanar, multisequence MR imaging of the lumbar spine was
performed. No intravenous contrast was administered.

[Series 3: T2 · sagittal · 4.0mm · 0.88mm/px · 6 of 12 slices shown (1 of 2)]
[im 1/12]
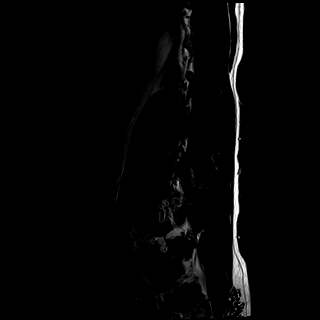
[im 3/12]
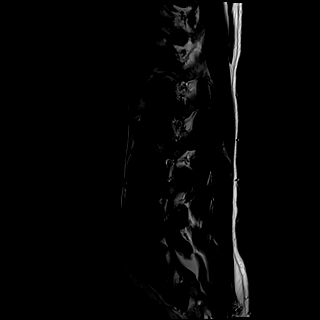
[im 5/12]
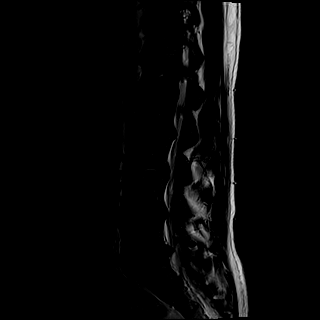
[im 7/12]
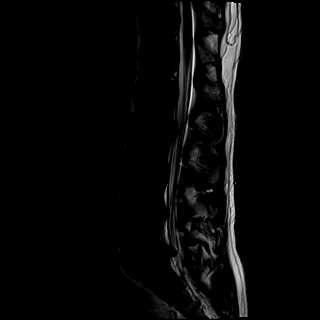
[im 9/12]
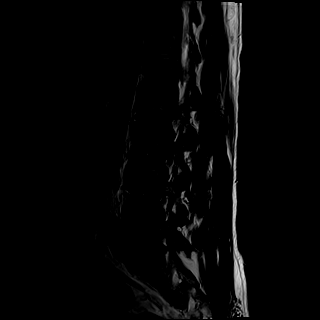
[im 12/12]
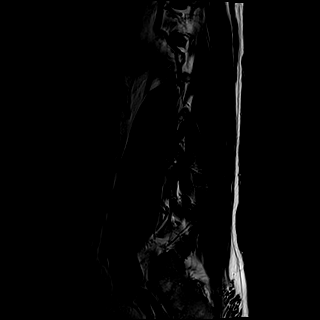

[Series 4: T1 · sagittal · 4.0mm · 0.88mm/px · 5 of 12 slices shown (1 of 2)]
[im 1/12]
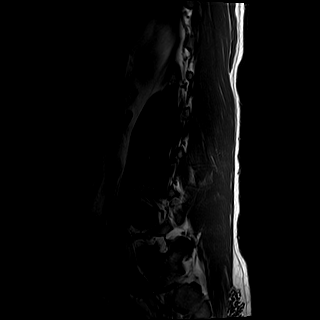
[im 3/12]
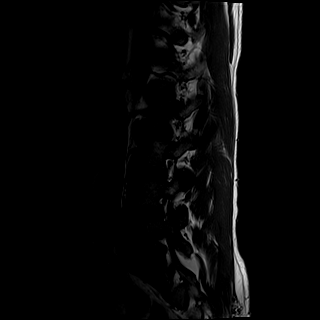
[im 6/12]
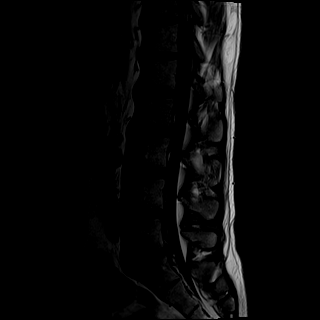
[im 9/12]
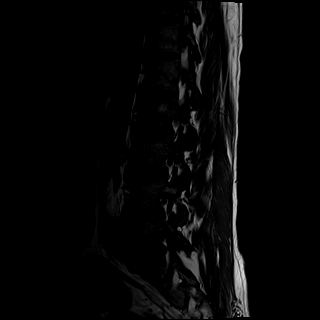
[im 12/12]
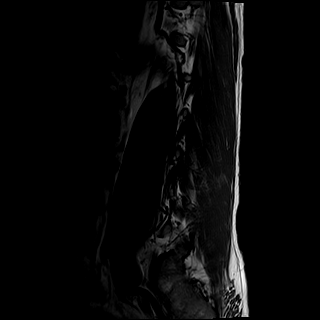

[Series 5: tirm sag · sagittal · 4.0mm · 0.55mm/px · 5 of 12 slices shown]
[im 1/12]
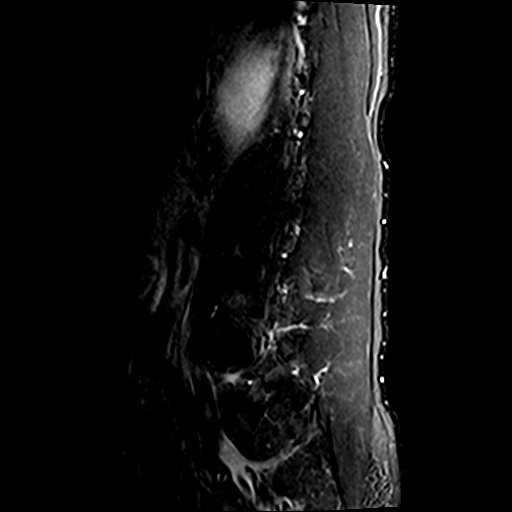
[im 3/12]
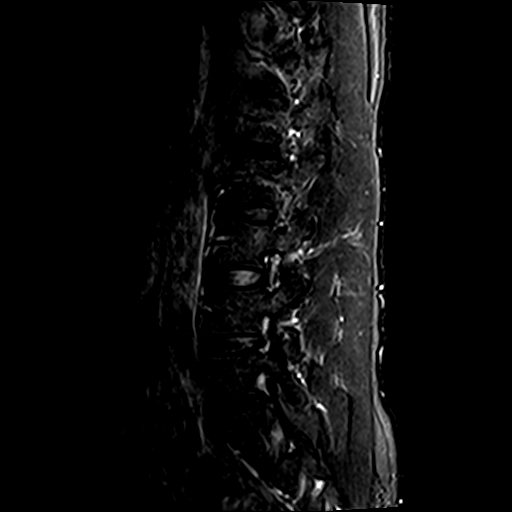
[im 6/12]
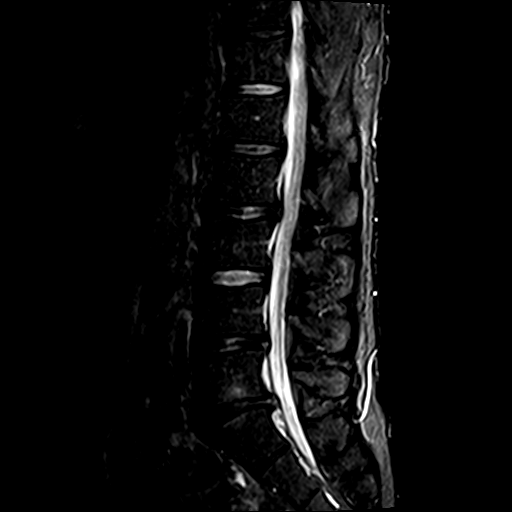
[im 9/12]
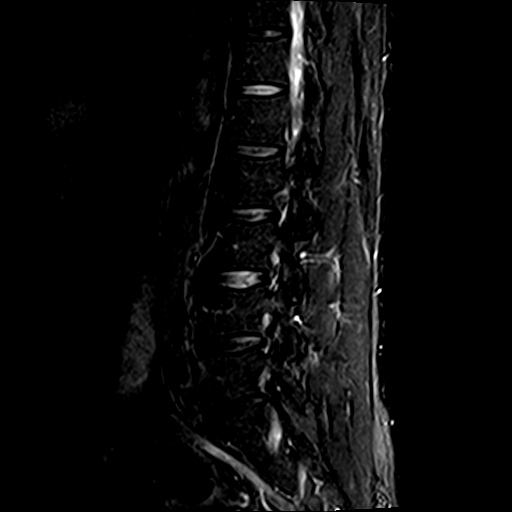
[im 12/12]
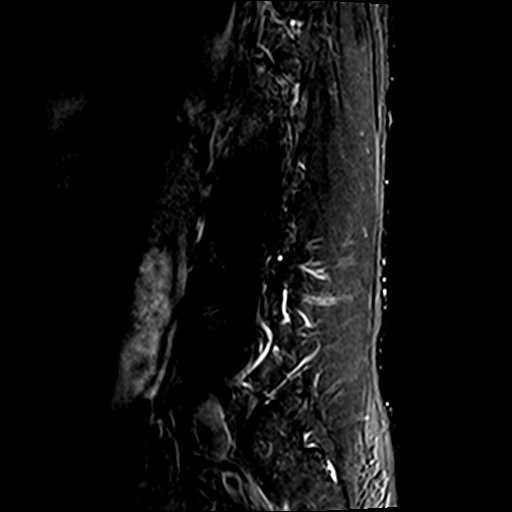

[Series 6: T1 · axial · 4.0mm · 0.78mm/px · z∈[-53,+158]mm · 14 of 39 slices shown (2 of 2)]
[im 1/39]
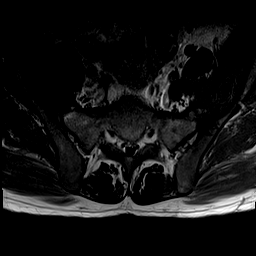
[im 3/39]
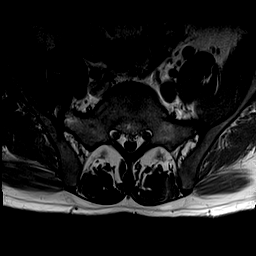
[im 6/39]
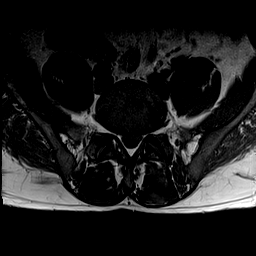
[im 8/39]
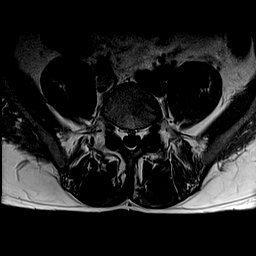
[im 11/39]
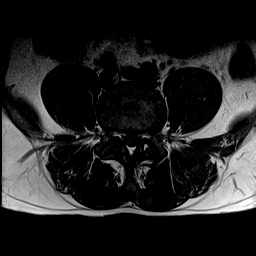
[im 13/39]
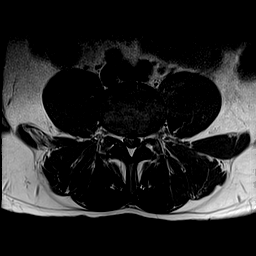
[im 16/39]
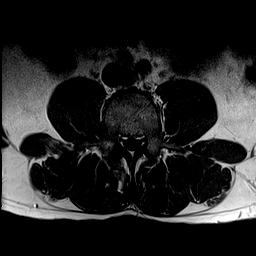
[im 18/39]
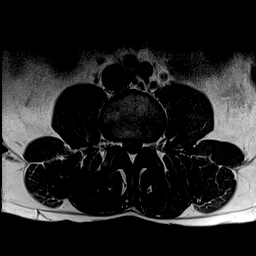
[im 21/39]
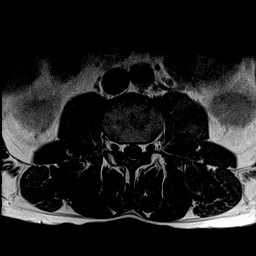
[im 23/39]
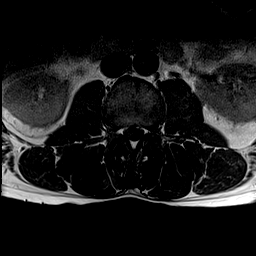
[im 26/39]
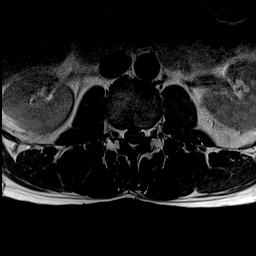
[im 28/39]
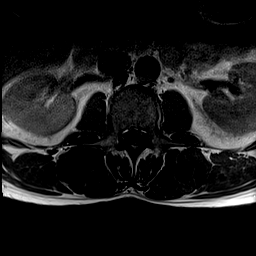
[im 33/39]
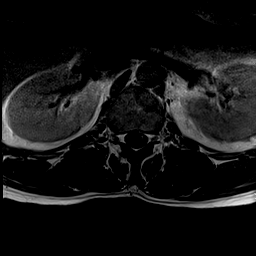
[im 39/39]
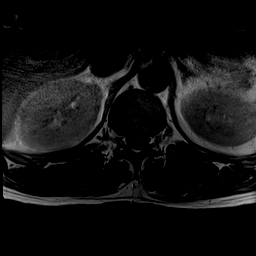

[Series 7: T2 · axial · 4.0mm · 0.78mm/px · z∈[-53,+158]mm · 16 of 39 slices shown (2 of 2)]
[im 1/39]
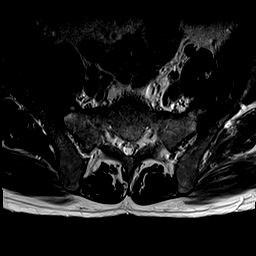
[im 3/39]
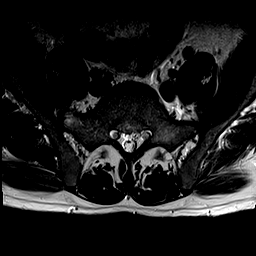
[im 6/39]
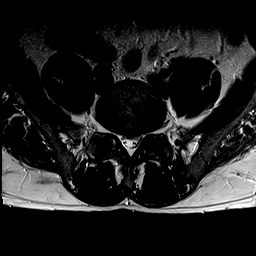
[im 8/39]
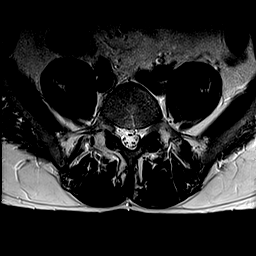
[im 11/39]
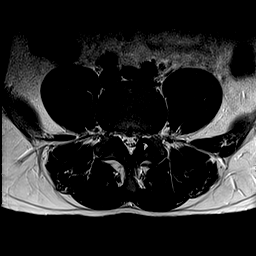
[im 13/39]
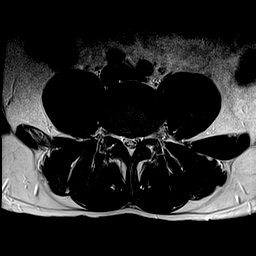
[im 16/39]
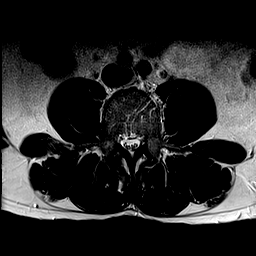
[im 18/39]
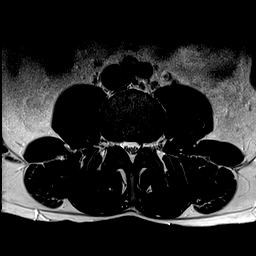
[im 21/39]
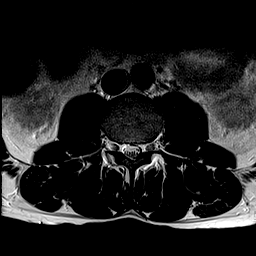
[im 23/39]
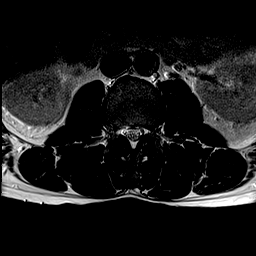
[im 26/39]
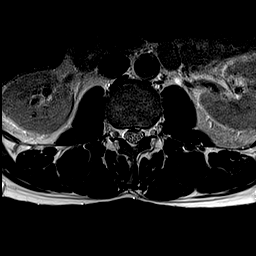
[im 28/39]
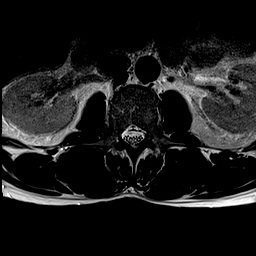
[im 31/39]
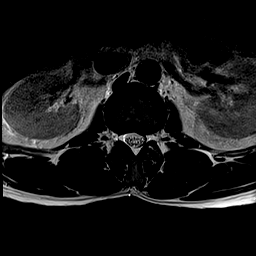
[im 33/39]
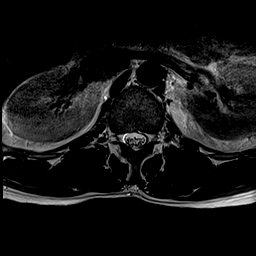
[im 36/39]
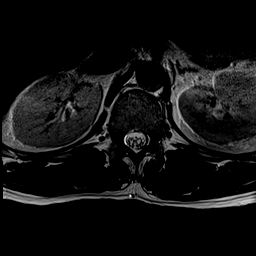
[im 39/39]
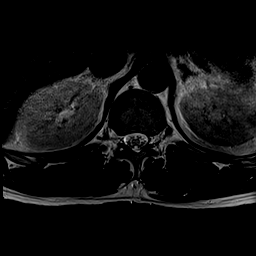

[46 of 48 positions shown; findings below may reference images not displayed]

FINDINGS: Segmentation:  Standard.

Alignment:  Physiologic.

Vertebrae:  Small Schmorl's node in the inferior endplate of L5.

Conus medullaris: Extends to the L1 level and appears normal.

Paraspinal and other soft tissues: Negative.

Disc levels:

L1-2: Tiny disc bulge just to the left of midline without neural
impingement.

L2-3: Small central subligamentous disc protrusion slightly
indenting the central portion of the thecal sac. Slight disc bulge
to the right of midline. No focal neural impingement.

L3-4:  Normal.

L4-5: Small central subligamentous disc protrusion symmetrically
indenting the thecal sac without focal neural impingement. Slight
hypertrophy of the facet joints and ligamentum flavum.

L5-S1: Focal soft disc extrusion central and to the left compressing
the left S1 nerve with a slight mass effect upon the left side of
the thecal sac. The small disc protrusion centrally and to the right
slightly compressing the right side of the thecal sac.
IMPRESSION: 1. Focal small disc extrusion at L5-S1 central and to the left
compressing the left S1 nerve.
2. Small disc protrusions central and to the right at L5-S1 with
slight compression of the thecal sac.
3. Small central disc protrusions at L2-3 and L4-5 without focal
neural impingement.
4. Focal tiny disc bulge at L1-2 to the left without neural
impingement.

## 2018-04-29 ENCOUNTER — Emergency Department (HOSPITAL_COMMUNITY)
Admission: EM | Admit: 2018-04-29 | Discharge: 2018-04-29 | Disposition: A | Discharge status: Home / Self Care | Payer: BLUE CROSS/BLUE SHIELD | Attending: Emergency Medicine | Admitting: Emergency Medicine

## 2018-04-29 ENCOUNTER — Emergency Department (HOSPITAL_COMMUNITY): Payer: BLUE CROSS/BLUE SHIELD

## 2018-04-29 ENCOUNTER — Other Ambulatory Visit: Payer: Self-pay

## 2018-04-29 ENCOUNTER — Encounter (HOSPITAL_COMMUNITY): Payer: Self-pay | Admitting: Emergency Medicine

## 2018-04-29 DIAGNOSIS — Y9389 Activity, other specified: Secondary | ICD-10-CM | POA: Insufficient documentation

## 2018-04-29 DIAGNOSIS — Y92410 Unspecified street and highway as the place of occurrence of the external cause: Secondary | ICD-10-CM | POA: Diagnosis not present

## 2018-04-29 DIAGNOSIS — S3992XA Unspecified injury of lower back, initial encounter: Secondary | ICD-10-CM | POA: Diagnosis present

## 2018-04-29 DIAGNOSIS — S39012A Strain of muscle, fascia and tendon of lower back, initial encounter: Secondary | ICD-10-CM | POA: Diagnosis not present

## 2018-04-29 DIAGNOSIS — F17290 Nicotine dependence, other tobacco product, uncomplicated: Secondary | ICD-10-CM | POA: Diagnosis not present

## 2018-04-29 DIAGNOSIS — I10 Essential (primary) hypertension: Secondary | ICD-10-CM | POA: Diagnosis not present

## 2018-04-29 DIAGNOSIS — Z79899 Other long term (current) drug therapy: Secondary | ICD-10-CM | POA: Insufficient documentation

## 2018-04-29 DIAGNOSIS — Y999 Unspecified external cause status: Secondary | ICD-10-CM | POA: Insufficient documentation

## 2018-04-29 MED ORDER — CYCLOBENZAPRINE HCL 10 MG PO TABS: 10.0000 mg | ORAL_TABLET | Freq: Three times a day (TID) | ORAL | 0 refills | Status: DC | PRN

## 2018-04-29 MED ORDER — OXYCODONE-ACETAMINOPHEN 5-325 MG PO TABS
1.0000 | ORAL_TABLET | Freq: Once | ORAL | Status: AC
Start: 2018-04-29 — End: 2018-04-29
  Administered 2018-04-29: 1 via ORAL
  Filled 2018-04-29: qty 1

## 2018-04-29 MED ORDER — NAPROXEN 500 MG PO TABS: 500.0000 mg | ORAL_TABLET | Freq: Two times a day (BID) | ORAL | 0 refills | Status: DC

## 2018-04-29 MED ORDER — OXYCODONE-ACETAMINOPHEN 5-325 MG PO TABS: 1.0000 | ORAL_TABLET | ORAL | 0 refills | Status: DC | PRN

## 2018-04-29 MED ORDER — IBUPROFEN 800 MG PO TABS
800.0000 mg | ORAL_TABLET | Freq: Once | ORAL | Status: AC
  Administered 2018-04-29: 800 mg via ORAL
  Filled 2018-04-29: qty 1

## 2018-04-29 MED ORDER — CYCLOBENZAPRINE HCL 10 MG PO TABS
10.0000 mg | ORAL_TABLET | Freq: Once | ORAL | Status: AC
  Administered 2018-04-29: 10 mg via ORAL
  Filled 2018-04-29: qty 1
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
## 2018-04-29 NOTE — ED Notes (Signed)
Pt discharged from ED; instructions provided and scripts given; Pt encouraged to return to ED if symptoms worsen and to f/u with PCP; Pt verbalized understanding of all instructions 

## 2018-04-29 NOTE — Discharge Instructions (Signed)
Apply ice for 20-30 minutes at a time, several times a day.

## 2018-04-29 NOTE — ED Notes (Signed)
Patient transported to X-ray 

## 2019-06-25 ENCOUNTER — Ambulatory Visit: Payer: BC Managed Care – PPO | Admitting: Podiatry

## 2020-06-07 ENCOUNTER — Other Ambulatory Visit: Payer: Self-pay

## 2020-06-07 ENCOUNTER — Emergency Department (HOSPITAL_COMMUNITY)
Admission: EM | Admit: 2020-06-07 | Discharge: 2020-06-08 | Disposition: A | Discharge status: Home / Self Care | Payer: BC Managed Care – PPO | Attending: Emergency Medicine | Admitting: Emergency Medicine

## 2020-06-07 DIAGNOSIS — R0602 Shortness of breath: Secondary | ICD-10-CM | POA: Diagnosis not present

## 2020-06-07 DIAGNOSIS — F17210 Nicotine dependence, cigarettes, uncomplicated: Secondary | ICD-10-CM | POA: Diagnosis not present

## 2020-06-07 DIAGNOSIS — I10 Essential (primary) hypertension: Secondary | ICD-10-CM | POA: Diagnosis not present

## 2020-06-07 DIAGNOSIS — Z20822 Contact with and (suspected) exposure to covid-19: Secondary | ICD-10-CM | POA: Insufficient documentation

## 2020-06-07 NOTE — ED Triage Notes (Signed)
Pt reports that he was diagnosed with pneumonia last week, tested for covid twice and it was negative. Given steroids and antibiotics. SOB continues and now having CP with it

## 2020-06-08 ENCOUNTER — Emergency Department (HOSPITAL_COMMUNITY): Payer: BC Managed Care – PPO

## 2020-06-08 ENCOUNTER — Encounter (HOSPITAL_COMMUNITY): Payer: Self-pay

## 2020-06-08 LAB — BASIC METABOLIC PANEL
Anion gap: 11 (ref 5–15)
BUN: 16 mg/dL (ref 6–20)
CO2: 22 mmol/L (ref 22–32)
Calcium: 9.5 mg/dL (ref 8.9–10.3)
Chloride: 103 mmol/L (ref 98–111)
Creatinine, Ser: 1.08 mg/dL (ref 0.61–1.24)
GFR calc Af Amer: >60 mL/min (ref >60)
GFR calc non Af Amer: >60 mL/min (ref >60)
Glucose, Bld: 208 mg/dL — ABNORMAL HIGH (ref 70–99)
Potassium: 4.1 mmol/L (ref 3.5–5.1)
Sodium: 136 mmol/L (ref 135–145)

## 2020-06-08 LAB — TROPONIN I (HIGH SENSITIVITY)
Troponin I (High Sensitivity): 6 ng/L (ref <18)
Troponin I (High Sensitivity): 4 ng/L (ref <18)

## 2020-06-08 LAB — CBC
HCT: 45.5 % (ref 39.0–52.0)
Hemoglobin: 15.3 g/dL (ref 13.0–17.0)
MCH: 30.5 pg (ref 26.0–34.0)
MCHC: 33.6 g/dL (ref 30.0–36.0)
MCV: 90.6 fL (ref 80.0–100.0)
Platelets: 284 10*3/uL (ref 150–400)
RBC: 5.02 MIL/uL (ref 4.22–5.81)
RDW: 13.5 % (ref 11.5–15.5)
WBC: 12.3 10*3/uL — ABNORMAL HIGH (ref 4.0–10.5)
nRBC: 0 % (ref 0.0–0.2)

## 2020-06-08 LAB — D-DIMER, QUANTITATIVE: D-Dimer, Quant: <0.27 ug/mL-FEU (ref 0.00–0.50)

## 2020-06-08 LAB — SARS CORONAVIRUS 2 BY RT PCR (HOSPITAL ORDER, PERFORMED IN ~~LOC~~ HOSPITAL LAB): SARS Coronavirus 2: NEGATIVE

## 2020-06-08 LAB — BRAIN NATRIURETIC PEPTIDE: B Natriuretic Peptide: 16.7 pg/mL (ref 0.0–100.0)

## 2020-06-08 MED ORDER — METHYLPREDNISOLONE SODIUM SUCC 125 MG IJ SOLR: 125.0000 mg | INTRAMUSCULAR | Status: DC

## 2020-06-08 MED ORDER — ALBUTEROL SULFATE HFA 108 (90 BASE) MCG/ACT IN AERS
2.0000 | INHALATION_SPRAY | Freq: Four times a day (QID) | RESPIRATORY_TRACT | Status: DC
  Administered 2020-06-08: 2 via RESPIRATORY_TRACT
  Filled 2020-06-08: qty 6.7

## 2020-06-08 MED ORDER — PREDNISONE 20 MG PO TABS: 40.0000 mg | ORAL_TABLET | Freq: Every day | ORAL | 0 refills | Status: DC

## 2020-06-08 MED ORDER — AZITHROMYCIN 250 MG PO TABS: 250.0000 mg | ORAL_TABLET | Freq: Every day | ORAL | 0 refills | Status: AC

## 2020-06-08 MED ORDER — AZITHROMYCIN 250 MG PO TABS
500.0000 mg | ORAL_TABLET | Freq: Once | ORAL | Status: AC
  Administered 2020-06-08: 500 mg via ORAL
  Filled 2020-06-08: qty 2

## 2020-06-08 MED ORDER — PREDNISONE 20 MG PO TABS
60.0000 mg | ORAL_TABLET | ORAL | Status: AC
  Administered 2020-06-08: 60 mg via ORAL
  Filled 2020-06-08: qty 3

## 2020-06-08 NOTE — ED Notes (Signed)
Pt presents to nurse first to obtain work note after recent discharge earlier this afternoon. This RN spoke with Dr. Jeraldine Loots who advised pt may get a work note to return on 06/14/20.

## 2020-06-08 NOTE — Discharge Instructions (Signed)
As discussed, today's evaluation has been generally reassuring. Your Covid test is negative, and your x-ray looks reassuring. Please use the provided albuterol every 4 hours the next 2 days, then as needed.  In addition, take your prescribed medication until completion.  Return here for concerning changes in your condition.

## 2020-06-08 NOTE — ED Notes (Signed)
Patient given discharge instructions. Questions were answered. Patient verbalized understanding of discharge instructions and care at home.  

## 2020-06-08 NOTE — ED Provider Notes (Signed)
[facility] EMERGENCY DEPARTMENT Provider Note   CSN: [id number] Arrival date & time: 06/07/20  2303     History Chief Complaint  Patient presents with  . Shortness of Breath    Jaime Hodges is a 49 y.o. male.  HPI    Patient presents with dyspnea, cough, mild discomfort.  Discomfort is sternal, seemingly worse with coughing. He also has been seen and evaluated twice at urgent care over this illness which began about 1 week ago. He notes that he started on antibiotics, steroids, and had a second prescription for steroids which he has already completed. Patient notes a history of smoking, states that he has stopped drinking alcohol. He has no known history of COPD. He states that he was informed that he had pneumonia, and negative Covid testing in the urgent care setting. Now, with persistent dyspnea, cough, discomfort he presents for evaluation.  Past Medical History:  Diagnosis Date  . Alcohol abuse   . Anemia   . Anxiety   . Depression   . High cholesterol   . Hypertension   . Low sodium levels   . Pneumonia 1990s   walking pneumonia X 1    Patient Active Problem List   Diagnosis Date Noted  . Alcohol-induced acute pancreatitis without infection or necrosis   . Hemoptysis   . Anemia 07/25/2016  . Alcohol abuse 07/25/2016  . Essential hypertension 07/25/2016  . Anxiety state 07/25/2016  . Alcohol use   . Strep pharyngitis     Past Surgical History:  Procedure Laterality Date  . ABSCESS DRAINAGE  ~ 2014   NECK  . BACK SURGERY         No family history on file.  Social History   Tobacco Use  . Smoking status: Current Every Day Smoker    Packs/day: 0.00    Years: 13.00    Pack years: 0.00    Types: Cigars  . Smokeless tobacco: Never Used  . Tobacco comment: "Black N Milds"  Substance Use Topics  . Alcohol use: Yes    Alcohol/week: 42.0 standard drinks    Types: 42 Cans of beer per week    Comment: 07/25/2016 "6 pack/day"    . Drug use: No    Home Medications Prior to Admission medications   Medication Sig Start Date End Date Taking? Authorizing Provider  amLODipine (NORVASC) 5 MG tablet Take 1 tablet by mouth daily. 06/30/16   [provider]  cyclobenzaprine (FLEXERIL) 10 MG tablet Take 1 tablet (10 mg total) by mouth 3 (three) times daily as needed for muscle spasms. 04/29/18   Dione Booze, MD  ferrous sulfate 325 (65 FE) MG tablet Take 1 tablet (325 mg total) by mouth at bedtime. 07/28/16   Eulah Pont, MD  naproxen (NAPROSYN) 500 MG tablet Take 1 tablet (500 mg total) by mouth 2 (two) times daily. 04/29/18   Dione Booze, MD  oxyCODONE-acetaminophen (PERCOCET) 5-325 MG tablet Take 1 tablet by mouth every 4 (four) hours as needed for moderate pain. 04/29/18   Dione Booze, MD  potassium chloride SA (K-DUR,KLOR-CON) 20 MEQ tablet Take 20 mEq by mouth daily.    [provider]  traZODone (DESYREL) 50 MG tablet Take 50 mg by mouth at bedtime. 07/22/16 04/29/26  [provider]    Allergies    Aspirin  Review of Systems   Review of Systems  Constitutional:       Per HPI, otherwise negative  HENT:  Per HPI, otherwise negative  Respiratory:       Per HPI, otherwise negative  Cardiovascular:       Per HPI, otherwise negative  Gastrointestinal: Negative for vomiting.  Endocrine:       Negative aside from HPI  Genitourinary:       Neg aside from HPI   Musculoskeletal:       Per HPI, otherwise negative  Skin: Negative.   Neurological: Negative for syncope.    Physical Exam Updated Vital Signs BP (!) 125/91   Pulse (!) 57   Temp 98.6 F (37 C) (Oral)   Resp 19   SpO2 94%   Physical Exam Vitals and nursing note reviewed.  Constitutional:      General: He is not in acute distress.    Appearance: He is well-developed.  HENT:     Head: Normocephalic and atraumatic.  Eyes:     Conjunctiva/sclera: Conjunctivae normal.  Cardiovascular:     Rate and Rhythm: Normal  rate and regular rhythm.  Pulmonary:     Effort: Pulmonary effort is normal. No respiratory distress.     Breath sounds: No stridor. Wheezing present.  Abdominal:     General: There is no distension.  Skin:    General: Skin is warm and dry.  Neurological:     Mental Status: He is alert and oriented to person, place, and time.     ED Results / Procedures / Treatments   Labs (all labs ordered are listed, but only abnormal results are displayed) Labs Reviewed  BASIC METABOLIC PANEL - Abnormal; Notable for the following components:      Result Value   Glucose, Bld 208 (*)    All other components within normal limits  CBC - Abnormal; Notable for the following components:   WBC 12.3 (*)    All other components within normal limits  SARS CORONAVIRUS 2 BY RT PCR (HOSPITAL ORDER, PERFORMED IN [facility])  BRAIN NATRIURETIC PEPTIDE  D-DIMER, QUANTITATIVE (NOT AT [facility])  TROPONIN I (HIGH SENSITIVITY)  TROPONIN I (HIGH SENSITIVITY)    EKG EKG Interpretation  Date/Time:  Tuesday June 08 2020 00:05:14 EDT Ventricular Rate:  91 PR Interval:  164 QRS Duration: 104 QT Interval:  344 QTC Calculation: 423 R Axis:   84 Text Interpretation: Normal sinus rhythm Nonspecific T wave abnormality Artifact No significant change since last tracing Abnormal ECG Confirmed by Gerhard Munch [phone]) on 06/08/2020 8:09:57 AM   Radiology DG Chest 2 View  Result Date: 06/08/2020 CLINICAL DATA:  Chest pain EXAM: CHEST - 2 VIEW COMPARISON:  07/25/2016 FINDINGS: The heart size and mediastinal contours are within normal limits. Both lungs are clear. The visualized skeletal structures are unremarkable. IMPRESSION: No active cardiopulmonary disease. Electronically Signed   By: Deatra Robinson M.D.   On: 06/08/2020 00:37    Procedures Procedures (including critical care time)  Medications Ordered in ED Medications  albuterol (VENTOLIN HFA) 108 (90 Base) MCG/ACT inhaler 2 puff (2 puffs  Inhalation Given 06/08/20 0923)  azithromycin (ZITHROMAX) tablet 500 mg (has no administration in time range)  methylPREDNISolone sodium succinate (SOLU-MEDROL) 125 mg/2 mL injection 125 mg (has no administration in time range)    ED Course  I have reviewed the triage vital signs and the nursing notes.  Pertinent labs & imaging results that were available during my care of the patient were reviewed by me and considered in my medical decision making (see chart for details).   To:, Patient in no  distress, no hypoxia. Patient has a picture of his medications, notably he is not taking antibiotic as promethazine liquid, and 20 mg of prednisone tablets, both of which have been he has completed.    Following albuterol, the patient has no hypoxia still, has no tachypnea, no evidence for respiratory distress. Labs reviewed, discussed, negative D-dimer, normal BNP, Covid negative, troponin x2 -, nonischemic EKG, all reassuring, little suspicion for atypical ACS, PE, heart failure.  On some suspicion for viral pneumonia versus COPD exacerbation, though the patient does not have a formal diagnosis of this. Absent hypoxia, respiratory distress, patient is appropriate for follow-up with her pulmonologist, after initiation of scheduled albuterol, steroids, high-dose.  Final Clinical Impression(s) / ED Diagnoses Final diagnoses:  Shortness of breath    Rx / DC Orders ED Discharge Orders         Ordered    predniSONE (DELTASONE) 20 MG tablet  Daily with breakfast        06/08/20 1224    azithromycin (ZITHROMAX) 250 MG tablet  Daily        06/08/20 1224           Gerhard Munch, MD 06/08/20 1224

## 2020-06-23 ENCOUNTER — Institutional Professional Consult (permissible substitution): Payer: BC Managed Care – PPO | Admitting: Pulmonary Disease

## 2020-08-19 ENCOUNTER — Institutional Professional Consult (permissible substitution): Payer: BC Managed Care – PPO | Admitting: Pulmonary Disease

## 2024-03-06 ENCOUNTER — Ambulatory Visit (INDEPENDENT_AMBULATORY_CARE_PROVIDER_SITE_OTHER): Admitting: Podiatry

## 2024-03-06 ENCOUNTER — Ambulatory Visit (INDEPENDENT_AMBULATORY_CARE_PROVIDER_SITE_OTHER)

## 2024-03-06 ENCOUNTER — Encounter: Payer: Self-pay | Admitting: Podiatry

## 2024-03-06 DIAGNOSIS — B070 Plantar wart: Secondary | ICD-10-CM

## 2024-03-06 DIAGNOSIS — M79671 Pain in right foot: Secondary | ICD-10-CM

## 2024-03-06 DIAGNOSIS — M79672 Pain in left foot: Secondary | ICD-10-CM

## 2024-03-06 DIAGNOSIS — M2012 Hallux valgus (acquired), left foot: Secondary | ICD-10-CM

## 2024-03-06 DIAGNOSIS — M778 Other enthesopathies, not elsewhere classified: Secondary | ICD-10-CM

## 2024-03-06 DIAGNOSIS — M2011 Hallux valgus (acquired), right foot: Secondary | ICD-10-CM

## 2024-03-06 NOTE — Progress Notes (Signed)
 Patient complains of pain on the plantar aspect of the right foot with a painful lesion present and also on the medial aspect of the hallux left.  The been bothering him for several months now and getting worse.  Painful with wearing shoes.  With walking and standing at his job.  Also complains of the painful onychomycotic nails with soreness around the toes when wearing shoes and walking.   Physical exam:  General appearance: Pleasant, and in no acute distress. AOx3.  Vascular: Pedal pulses: DP 2/4, PT 2/4.  No edema lower legs bilaterally. Capillary fill time immediate.  Neurological: Light touch intact feet bilaterally.  Normal Achilles reflex bilaterally.  No clonus or spasticity noted.  Negative Mulder sign right foot.  Dermatologic:   Plantar aspect forefoot right and medial aspect hallux left with the exhibit pinpoint bleeding upon debridement and skin lines going around the lesion.  Tender to touch with lateral pressure.  Mycosis toenails 1 through 5 bilaterally skin normal temperature bilaterally.  Skin normal color, tone, and texture bilaterally.   Musculoskeletal: Mild to moderate hallux abductovalgus deformity with hallux interphalange ES deformity bilaterally no tenderness with range of motion of the lesser MTPs.  Good range of motion foot and ankle.  Muscle strength around ankle and foot normal bilaterally  Radiographs: 3 views feet bilaterally: Mild to moderate hallux abductovalgus deformity.  Hallux interphalange ES deformity.  No signs of any fractures or dislocations.  No evidence of any arthritic changes along the MTPs.  Normal bone density.  No bone tumors noted.  Diagnosis: 1.  Pain feet bilaterally. 2.  Plantar verruca x 2 feet bilaterally. 3.  Hallux abductovalgus Forni bilaterally.  Plan: -New patient office visit level 3 for evaluation and management. -Discussed plantar verruca and treatment and etiology.  Will start with application of Salinocaine to the  lesions.  Lesions takes 2 or 3 applications to get rid of these.  If not we can always biopsy of the lesions.  Also discussed the onychomycosis on his next visit we will start Lamisil for this. - Discussed proper shoes. -Applied Salinocaine compound to lesion(s) as noted in physical exam after debriding lesions to pinpoint bleeding.  Salinocaine applied to lesion(s) and covered with an occlusive dressing with Coban wrap.  Written and oral instructions given to patient.  -Return in 2 weeks for follow-up verruca and start Lamisil

## 2024-03-19 ENCOUNTER — Ambulatory Visit: Admitting: Podiatry

## 2024-03-19 DIAGNOSIS — M79672 Pain in left foot: Secondary | ICD-10-CM

## 2024-03-19 DIAGNOSIS — B351 Tinea unguium: Secondary | ICD-10-CM | POA: Diagnosis not present

## 2024-03-19 DIAGNOSIS — D489 Neoplasm of uncertain behavior, unspecified: Secondary | ICD-10-CM

## 2024-03-19 DIAGNOSIS — M79671 Pain in right foot: Secondary | ICD-10-CM | POA: Diagnosis not present

## 2024-03-19 NOTE — Progress Notes (Signed)
 Resident for follow-up verrucous lesions feet bilaterally.  Lesions began to hurt after day or 2 of having the dressing on.  He did keep it on for the full 2 days has been feeling a little bit better but still sore also complains of the painful nails 1 through 5 bilaterally with onychomycosis tenderness along the nail border borders and nail folds with walking and wearing shoes   Physical exam:  General appearance: Pleasant, and in no acute distress. AOx3.  Vascular: Pedal pulses: DP 2/4 bilaterally, PT 2/4 to bilaterally.  None edema lower legs bilaterally. Capillary fill time immediate.  Neurological: Light touch intact feet bilaterally.  Normal Achilles reflex bilaterally.    Dermatologic:   Painful 1.5 mm diameter lesion that is raised with some pigmentation plantar aspect right foot.  Somewhat verrucous appearance.  Lesion on the hallux left is very inflamed from the salicylic acid.  Seems to have been reduced but is hard to make a determination because of the maceration of the skin.  Thick dystrophic onychomycotic nails with ascetic thickening discoloration and tenderness along the nail folds 1 through 5 bilaterally skin normal temperature bilaterally.  Skin normal color, tone, and texture bilaterally.   Musculoskeletal: Tenderness medial aspect hallux left.   Diagnosis: 1.  Painful onychomycosis 1 through 5 bilaterally 2.  Pain feet bilaterally. 3.  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  Plan: -Established visit level 3 for evaluation and management. -Discussed with him the onychomycosis and treatment with Lamisil.  Discussed risk and benefits of Lamisil treatment for onychomycosis.  Discussed possible liver toxicity patient would like to go ahead with treatment.  Also discussed with him the lesion on the plantar aspect of the left foot and on the left hallux.  We will see him in 2 weeks and we will do a biopsy with a punch biopsy on the plantar aspect of  the right foot.  Will see what happens of the lesion on the left.  Once the inflammation is reduced and the maceration clears again make a better determination of the lesion -Rx Lamisil 250 mg 1 p.o. daily - Orders for blood draw for LFTs  Return 2 weeks biopsy with punch biopsy plantar lesion right foot.  And return in 4 weeks for all #2 and follow-up biopsy

## 2024-04-02 ENCOUNTER — Ambulatory Visit: Admitting: Podiatry

## 2024-04-09 ENCOUNTER — Ambulatory Visit: Admitting: Podiatry

## 2024-04-14 ENCOUNTER — Ambulatory Visit: Admitting: Podiatry

## 2024-04-16 ENCOUNTER — Ambulatory Visit: Admitting: Podiatry

## 2024-04-16 DIAGNOSIS — B351 Tinea unguium: Secondary | ICD-10-CM

## 2024-04-16 DIAGNOSIS — D489 Neoplasm of uncertain behavior, unspecified: Secondary | ICD-10-CM

## 2024-04-16 DIAGNOSIS — M79671 Pain in right foot: Secondary | ICD-10-CM

## 2024-04-16 DIAGNOSIS — M79672 Pain in left foot: Secondary | ICD-10-CM

## 2024-04-16 MED ORDER — TERBINAFINE HCL 250 MG PO TABS: 250.0000 mg | ORAL_TABLET | Freq: Every day | ORAL | 0 refills | Status: AC

## 2024-04-16 NOTE — Addendum Note (Signed)
 Addended by: GERRIT ANDREZ CROME on: 04/16/2024 05:03 PM   Modules accepted: Orders

## 2024-04-16 NOTE — Progress Notes (Signed)
.    Subjective:    HPI Presents for follow-up onychomycosis treatment with p.o. Lamisil .  No problems taking medicine with no side effects noted.  Tenderness around toes with walking and wearing shoes.   Objective:  Physical Exam   General: AAO x3, NAD  Vascular: DP and PT pulses palpable bilaterally.  Immedate capillary fill time digits. No significant lower extremity edema bilaterally.  Dermatological: Neoplastic lesion of uncertain behavior plantar right foot measures 2 mm in diameter.  Tenderness with pressure directly on the lesion.  Some pinpoint hemorrhages present within the lesion.  Onychomycotic mycotic changes nails 1 through 5 with discoloration nail and subungual debris and thickening of the nail. 0% Clearance of onychomycotic nail changes noted. Tenderness with walking and wearing shoes.  Neruologic: Grossly intact B/L  Musculoskeletal:   Assessment:  Painful onychomycotic nails 1 through 5 bilaterally. Pain feet b/l 3.  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  Modifier 25 for management of onychomycosis utilizing Lamisil .  Patient is tolerating Lamisil  well today.  Wrote prescription new prescription for Lamisil .  And also ordered labs for LFTs -Patient is tolerating Lamisil  treatment for onychomycotic nails well.  No side effects noted. Will continue this treatment. -Rx: Lamisil  250 mg p.o. daily - Labs ordered today for liver function tests to monitor for any hepatic side effects from the Lamisil .  - Return in 4 weeks for  Lamisil  3   Biopsy lesion plantar right foot:  -Consent reviewed and signed for biopsy of skin lesion. -Surgical site(s) anesthetized with 3 cc of a one-to-one mixture of 0.5% Marcaine plain and 2% lidocaine with epi.  Surgical site prepped.  Punch biopsies of 1 lesion(s) noted in physical exam were performed with an appropriately sized biopsy punch(s).   Lesions were sent for pathological evaluation.  Triple antibiotic ointment was applied to the surgery sites and a gauze and Coban dressing applied.  Written and oral postoperative instructions were given.  -Return in 2 weeks for POV and to discuss pathology results.

## 2024-04-18 LAB — TISSUE SPECIMEN

## 2024-04-18 LAB — PATHOLOGY REPORT

## 2024-04-30 ENCOUNTER — Ambulatory Visit: Admitting: Podiatry

## 2024-04-30 DIAGNOSIS — D489 Neoplasm of uncertain behavior, unspecified: Secondary | ICD-10-CM

## 2024-04-30 NOTE — Progress Notes (Signed)
 Having some pain at the biopsy site.  Has not noticed any redness or drainage.   Physical exam:  General appearance: Pleasant, and in no acute distress. AOx3.  Vascular: Pedal pulses: DP 2/4 bilaterally, PT 2/4 bilaterally.  Neurological: Grossly intact bilaterally  Dermatologic:   Biopsy site healing well.  Some tenderness.  No signs of infection.  Skin normal temperature bilaterally.  Skin normal color, tone, and texture bilaterally.   Musculoskeletal:   Pathology: Results are still pending  Diagnosis: 1.  Neoplasm of uncertain behavior right foot.  Plan: -Discussed with him the lack of pathology results hopefully when I see him in 4 weeks for Lamisil  prescription will be we will have the results we will discuss them then   Return 2 weeks as scheduled for Lamisil  3

## 2024-05-14 ENCOUNTER — Ambulatory Visit: Admitting: Podiatry

## 2024-05-19 ENCOUNTER — Other Ambulatory Visit: Payer: Self-pay | Admitting: Podiatry

## 2024-05-26 ENCOUNTER — Ambulatory Visit: Admitting: Podiatry

## 2024-07-08 ENCOUNTER — Ambulatory Visit (INDEPENDENT_AMBULATORY_CARE_PROVIDER_SITE_OTHER): Admitting: Podiatry

## 2024-07-08 DIAGNOSIS — B070 Plantar wart: Secondary | ICD-10-CM

## 2024-07-08 NOTE — Progress Notes (Signed)
 Some pain in the plantar aspect of the right foot.  Still pain in the area still has a lesion developing.   Physical exam:  General appearance: Pleasant, and in no acute distress. AOx3.  Vascular: Pedal pulses: DP 2/4 bilaterally, PT 2/4 bilaterally. Mild edema lower legs bilaterally. Capillary fill time immediate b/l.SABRA  Neurological: Grossly intact bilaterally  Dermatologic:   Somewhat verrucous porokeratotic lesions subfourth MTP right foot.  Tenderness lateral pressure on the lesion.  Skin lines partially bound around the lesion.  Site of skin biopsy or months ago.  Skin normal temperature bilaterally.  Skin normal color, tone, and texture bilaterally.   Musculoskeletal: Tenderness of fourth MTP right foot.  Pathology: Skin lesion lesion had no significant atypia melanocytes or signs of malignancy.  Diagnosis: 1.  Plantar verruca right foot  Plan: -Discussed the lesion that we will want to try another attempt with Salinocaine.  When he comes in again we will review his x-rays and also possibly get him started on the Lamisil  again.  -Applied Salinocaine compound to lesion(s) as noted in physical exam after debriding lesions to pinpoint bleeding.  Salinocaine applied to lesion(s) and covered with an occlusive dressing with Coban wrap.  Written and oral instructions given to patient.,  -Return in 2 weeks for follow-up and restart Lamisil 

## 2024-07-23 ENCOUNTER — Encounter: Payer: Self-pay | Admitting: Podiatry

## 2024-07-23 ENCOUNTER — Ambulatory Visit: Admitting: Podiatry

## 2024-07-23 DIAGNOSIS — B351 Tinea unguium: Secondary | ICD-10-CM | POA: Diagnosis not present

## 2024-07-23 DIAGNOSIS — M79672 Pain in left foot: Secondary | ICD-10-CM | POA: Diagnosis not present

## 2024-07-23 DIAGNOSIS — M79671 Pain in right foot: Secondary | ICD-10-CM | POA: Diagnosis not present

## 2024-07-23 DIAGNOSIS — B070 Plantar wart: Secondary | ICD-10-CM | POA: Diagnosis not present

## 2024-07-23 MED ORDER — TERBINAFINE HCL 250 MG PO TABS: ORAL_TABLET | ORAL | 1 refills | Status: AC

## 2024-07-23 NOTE — Progress Notes (Signed)
 Presents today follow-up plantar verruca right foot.  Some improvement although still sore.  Also presents today to start Lamisil  again he has had it in the past but never finished treatment.  Nail still giving problems are painful with walking and wearing shoes.  Nail folds at the edge of the nails get sore and red sometimes   Physical exam:  General appearance: Pleasant, and in no acute distress. AOx3.  Vascular: Pedal pulses: DP 2/4 bilaterally, PT 2/4 bilaterally.  Mild edema lower legs bilaterally. Capillary fill time immediate bilaterally.  Neurological: Grossly intact bilaterally  Dermatologic:   Thick dystrophic nails with discoloration and subungual debris and dystrophy 1 through 5 bilaterally.  Tenderness along the nail folds.  Verrucous lesion improved with reduction in size and tenderness skin lines still go around the lesions with tenderness with lateral pressure on the lesion skin normal temperature bilaterally.  Skin normal color, tone, and texture bilaterally.   Musculoskeletal: Toes 2 through 5 bilaterally    Diagnosis: 1.  Onychomycosis with pain 1 through 5 bilaterally 2.  Pain feet bilaterally. 3.  Plantar verruca right foot-improved  Plan: -Established office visit for evaluation and management level 3.  Modifier 25. - Separate office visit today for evaluation and management of onychomycosis in the nails 1 through 5.  Discussed with him treatment options including topical versus oral compounds.  Discussed risk and benefits of both.  Patient has no history of liver or kidney disease.  Patient wants to try the oral medication again.  Will check LFTs every 6 weeks while taking. -Rx Lamisil  250 mg 1 p.o. daily, refill x 1  -Applied Salinocaine compound to lesion(s) as noted in physical exam after debriding lesions to pinpoint bleeding.  Salinocaine applied to lesion(s) and covered with an occlusive dressing with Coban wrap.  Written and oral instructions given to  patient.    Return 2 weeks follow-up plantar verruca right Return 6 weeks Lamisil  3

## 2024-07-24 LAB — HEPATIC FUNCTION PANEL
ALT: 34 IU/L (ref 0–44)
AST: 30 IU/L (ref 0–40)
Albumin: 4.6 g/dL (ref 3.8–4.9)
Alkaline Phosphatase: 86 IU/L (ref 47–123)
Bilirubin Total: 0.3 mg/dL (ref 0.0–1.2)
Bilirubin, Direct: 0.1 mg/dL (ref 0.00–0.40)
Total Protein: 7.2 g/dL (ref 6.0–8.5)

## 2024-09-01 ENCOUNTER — Other Ambulatory Visit: Payer: Self-pay | Admitting: Podiatry

## 2024-09-01 DIAGNOSIS — B351 Tinea unguium: Secondary | ICD-10-CM

## 2024-09-03 ENCOUNTER — Ambulatory Visit: Admitting: Podiatry

## 2024-09-10 ENCOUNTER — Ambulatory Visit: Admitting: Podiatry
